# Patient Record
Sex: Male | Born: 1960
Health system: Southern US, Community
[De-identification: ages and names within clinical notes are randomized; demographics above are authoritative.]

## PROBLEM LIST (undated history)

## (undated) DIAGNOSIS — G4730 Sleep apnea, unspecified: Secondary | ICD-10-CM

## (undated) DIAGNOSIS — E785 Hyperlipidemia, unspecified: Secondary | ICD-10-CM

## (undated) DIAGNOSIS — R413 Other amnesia: Secondary | ICD-10-CM

## (undated) DIAGNOSIS — E039 Hypothyroidism, unspecified: Secondary | ICD-10-CM

## (undated) DIAGNOSIS — D352 Benign neoplasm of pituitary gland: Secondary | ICD-10-CM

## (undated) DIAGNOSIS — I639 Cerebral infarction, unspecified: Secondary | ICD-10-CM

## (undated) DIAGNOSIS — E538 Deficiency of other specified B group vitamins: Secondary | ICD-10-CM

## (undated) DIAGNOSIS — N183 Chronic kidney disease, stage 3 unspecified: Secondary | ICD-10-CM

## (undated) DIAGNOSIS — I359 Nonrheumatic aortic valve disorder, unspecified: Secondary | ICD-10-CM

## (undated) DIAGNOSIS — M791 Myalgia, unspecified site: Secondary | ICD-10-CM

## (undated) DIAGNOSIS — I82412 Acute embolism and thrombosis of left femoral vein: Secondary | ICD-10-CM

## (undated) DIAGNOSIS — R768 Other specified abnormal immunological findings in serum: Secondary | ICD-10-CM

## (undated) DIAGNOSIS — D510 Vitamin B12 deficiency anemia due to intrinsic factor deficiency: Secondary | ICD-10-CM

## (undated) DIAGNOSIS — K589 Irritable bowel syndrome without diarrhea: Secondary | ICD-10-CM

## (undated) DIAGNOSIS — R202 Paresthesia of skin: Secondary | ICD-10-CM

## (undated) DIAGNOSIS — D6861 Antiphospholipid syndrome: Secondary | ICD-10-CM

## (undated) DIAGNOSIS — G43109 Migraine with aura, not intractable, without status migrainosus: Secondary | ICD-10-CM

## (undated) DIAGNOSIS — F419 Anxiety disorder, unspecified: Secondary | ICD-10-CM

## (undated) DIAGNOSIS — G43709 Chronic migraine without aura, not intractable, without status migrainosus: Secondary | ICD-10-CM

## (undated) HISTORY — PX: COLONOSCOPY: SHX174

## (undated) HISTORY — PX: OTHER SURGICAL HISTORY: SHX169

## (undated) HISTORY — PX: ESOPHAGOGASTRODUODENOSCOPY: SHX1529

---

## 2004-03-05 ENCOUNTER — Ambulatory Visit: Payer: Self-pay | Admitting: Internal Medicine

## 2004-03-22 ENCOUNTER — Ambulatory Visit: Payer: Self-pay | Admitting: Internal Medicine

## 2004-05-24 ENCOUNTER — Ambulatory Visit: Payer: Self-pay | Admitting: Unknown Physician Specialty

## 2005-07-22 ENCOUNTER — Ambulatory Visit: Payer: Self-pay | Admitting: Internal Medicine

## 2006-10-29 ENCOUNTER — Ambulatory Visit: Payer: Self-pay | Admitting: Internal Medicine

## 2006-11-04 ENCOUNTER — Ambulatory Visit: Payer: Self-pay | Admitting: Internal Medicine

## 2007-02-23 ENCOUNTER — Ambulatory Visit: Payer: Self-pay | Admitting: Unknown Physician Specialty

## 2009-02-16 ENCOUNTER — Ambulatory Visit: Payer: Self-pay | Admitting: Unknown Physician Specialty

## 2010-12-25 ENCOUNTER — Ambulatory Visit: Payer: Self-pay

## 2010-12-27 ENCOUNTER — Ambulatory Visit: Payer: Self-pay | Admitting: Unknown Physician Specialty

## 2013-05-19 ENCOUNTER — Ambulatory Visit: Payer: Self-pay | Admitting: Internal Medicine

## 2014-11-19 ENCOUNTER — Emergency Department
Admission: EM | Admit: 2014-11-19 | Discharge: 2014-11-19 | Disposition: A | Discharge status: Home / Self Care | Payer: 59 | Attending: Emergency Medicine | Admitting: Emergency Medicine

## 2014-11-19 ENCOUNTER — Encounter: Payer: Self-pay | Admitting: Emergency Medicine

## 2014-11-19 ENCOUNTER — Emergency Department: Payer: 59

## 2014-11-19 DIAGNOSIS — M79605 Pain in left leg: Secondary | ICD-10-CM | POA: Insufficient documentation

## 2014-11-19 DIAGNOSIS — M25512 Pain in left shoulder: Secondary | ICD-10-CM | POA: Insufficient documentation

## 2014-11-19 DIAGNOSIS — M79662 Pain in left lower leg: Secondary | ICD-10-CM

## 2014-11-19 DIAGNOSIS — R2242 Localized swelling, mass and lump, left lower limb: Secondary | ICD-10-CM | POA: Diagnosis present

## 2014-11-19 HISTORY — DX: Other amnesia: R41.3

## 2014-11-19 HISTORY — DX: Migraine with aura, not intractable, without status migrainosus: G43.109

## 2014-11-19 HISTORY — DX: Irritable bowel syndrome, unspecified: K58.9

## 2014-11-19 MED ORDER — NAPROXEN 500 MG PO TABS: 500.0000 mg | ORAL_TABLET | Freq: Two times a day (BID) | ORAL | Status: AC

## 2014-11-19 NOTE — Discharge Instructions (Signed)
Ultrasound of your left leg does not find any DVT. The pain appears to be due to muscle strain or possibly a previously ruptured Baker cyst. This can be expected to improve with rest, heating pad, elevation, anti-inflammatory pain medicine like ibuprofen or naproxen, and time.  Heat Therapy Heat therapy can help ease sore, stiff, injured, and tight muscles and joints. Heat relaxes your muscles, which may help ease your pain.  RISKS AND COMPLICATIONS If you have any of the following conditions, do not use heat therapy unless your health care provider has approved:  Poor circulation.  Healing wounds or scarred skin in the area being treated.  Diabetes, heart disease, or high blood pressure.  Not being able to feel (numbness) the area being treated.  Unusual swelling of the area being treated.  Active infections.  Blood clots.  Cancer.  Inability to communicate pain. This may include young children and people who have problems with their brain function (dementia).  Pregnancy. Heat therapy should only be used on old, pre-existing, or long-lasting (chronic) injuries. Do not use heat therapy on new injuries unless directed by your health care provider. HOW TO USE HEAT THERAPY There are several different kinds of heat therapy, including:  Moist heat pack.  Warm water bath.  Hot water bottle.  Electric heating pad.  Heated gel pack.  Heated wrap.  Electric heating pad. Use the heat therapy method suggested by your health care provider. Follow your health care provider's instructions on when and how to use heat therapy. GENERAL HEAT THERAPY RECOMMENDATIONS  Do not sleep while using heat therapy. Only use heat therapy while you are awake.  Your skin may turn pink while using heat therapy. Do not use heat therapy if your skin turns red.  Do not use heat therapy if you have new pain.  High heat or long exposure to heat can cause burns. Be careful when using heat therapy to  avoid burning your skin.  Do not use heat therapy on areas of your skin that are already irritated, such as with a rash or sunburn. SEEK MEDICAL CARE IF:  You have blisters, redness, swelling, or numbness.  You have new pain.  Your pain is worse. MAKE SURE YOU:  Understand these instructions.  Will watch your condition.  Will get help right away if you are not doing well or get worse. Document Released: 06/23/2011 Document Revised: 08/15/2013 Document Reviewed: 05/24/2013 [facility] Patient Information 2015 [place], [place]. This information is not intended to replace advice given to you by your health care provider. Make sure you discuss any questions you have with your health care provider.  Musculoskeletal Pain Musculoskeletal pain is muscle and boney aches and pains. These pains can occur in any part of the body. Your caregiver may treat you without knowing the cause of the pain. They may treat you if blood or urine tests, X-rays, and other tests were normal.  CAUSES There is often not a definite cause or reason for these pains. These pains may be caused by a type of germ (virus). The discomfort may also come from overuse. Overuse includes working out too hard when your body is not fit. Boney aches also come from weather changes. Bone is sensitive to atmospheric pressure changes. HOME CARE INSTRUCTIONS   Ask when your test results will be ready. Make sure you get your test results.  Only take over-the-counter or prescription medicines for pain, discomfort, or fever as directed by your caregiver. If you were given medications for  your condition, do not drive, operate machinery or power tools, or sign legal documents for 24 hours. Do not drink alcohol. Do not take sleeping pills or other medications that may interfere with treatment.  Continue all activities unless the activities cause more pain. When the pain lessens, slowly resume normal activities. Gradually increase the intensity and  duration of the activities or exercise.  During periods of severe pain, bed rest may be helpful. Lay or sit in any position that is comfortable.  Putting ice on the injured area.  Put ice in a bag.  Place a towel between your skin and the bag.  Leave the ice on for 15 to 20 minutes, 3 to 4 times a day.  Follow up with your caregiver for continued problems and no reason can be found for the pain. If the pain becomes worse or does not go away, it may be necessary to repeat tests or do additional testing. Your caregiver may need to look further for a possible cause. SEEK IMMEDIATE MEDICAL CARE IF:  You have pain that is getting worse and is not relieved by medications.  You develop chest pain that is associated with shortness or breath, sweating, feeling sick to your stomach (nauseous), or throw up (vomit).  Your pain becomes localized to the abdomen.  You develop any new symptoms that seem different or that concern you. MAKE SURE YOU:   Understand these instructions.  Will watch your condition.  Will get help right away if you are not doing well or get worse. Document Released: 03/31/2005 Document Revised: 06/23/2011 Document Reviewed: 12/03/2012 [facility] Patient Information 2015 [place], [place]. This information is not intended to replace advice given to you by your health care provider. Make sure you discuss any questions you have with your health care provider.

## 2014-11-19 NOTE — ED Provider Notes (Signed)
[facility] Emergency Department Provider Note  ____________________________________________  Time seen: 6:20 PM  I have reviewed the triage vital signs and the nursing notes.   HISTORY  Chief Complaint Leg Swelling    HPI Tommy Daniels is a 54 y.o. male who complains of pain and swelling to the left calf 4 days. No travel trauma injury surgery. No previous surgery and is to the area. He noted that he did have a knot in his mid calf at the onset of the pain. No history of DVT PE.     Past Medical History  Diagnosis Date  . Short-term memory loss   . Ocular migraine   . IBS (irritable bowel syndrome)     There are no active problems to display for this patient.   History reviewed. No pertinent past surgical history.  Current Outpatient Rx  Name  Route  Sig  Dispense  Refill  . naproxen (NAPROSYN) 500 MG tablet   Oral   Take 1 tablet (500 mg total) by mouth 2 (two) times daily with a meal.   20 tablet   0     Allergies Review of patient's allergies indicates no known allergies.  No family history on file.  Social History History  Substance Use Topics  . Smoking status: Never Smoker   . Smokeless tobacco: Not on file  . Alcohol Use: No    Review of Systems  Constitutional: No fever or chills. No weight changes Eyes:No blurry vision or double vision.  ENT: No sore throat. Cardiovascular: No chest pain. Respiratory: No dyspnea or cough. Gastrointestinal: Negative for abdominal pain, vomiting and diarrhea.  No BRBPR or melena. Genitourinary: Negative for dysuria, urinary retention, bloody urine, or difficulty urinating. Musculoskeletal: Left leg pain as above. Intermittent sharp left scapular pain that occurs every week or 2 and last for a few seconds, which has been occurring for the past several months.  Skin: Negative for rash. Neurological: Negative for headaches, focal weakness or numbness. Psychiatric:No anxiety or  depression.   Endocrine:No hot/cold intolerance, changes in energy, or sleep difficulty.  10-point ROS otherwise negative.  ____________________________________________   PHYSICAL EXAM:  VITAL SIGNS: ED Triage Vitals  Enc Vitals Group     BP 11/19/14 1635 120/69 mmHg     Pulse Rate 11/19/14 1635 61     Resp 11/19/14 1635 16     Temp 11/19/14 1635 97.7 F (36.5 C)     Temp Source 11/19/14 1635 Oral     SpO2 11/19/14 1635 98 %     Weight 11/19/14 1635 229 lb (103.874 kg)     Height 11/19/14 1635 5\' 11"  (1.803 m)     Head Cir --      Peak Flow --      Pain Score 11/19/14 1636 3     Pain Loc --      Pain Edu? --      Excl. in [place]? --      Constitutional: Alert and oriented. Well appearing and in no distress. Ambulatory Eyes: No scleral icterus. No conjunctival pallor. Marland Kitchen EOMI  Musculoskeletal: Left calf swollen and tender to the touch in the bulk of the muscle belly of the left calf. No tenderness or pain along the Achilles tendon. Popliteal fossa is unremarkable. No warmth or erythema or induration. No fluctuance or crepitus. Neurologic:   Normal speech and language.  CN 2-10 normal. Motor grossly intact. No pronator drift.  Normal gait. No gross focal neurologic deficits are  appreciated.  Skin:  Skin is warm, dry and intact. No rash noted.  No petechiae, purpura, or bullae. Psychiatric: Mood and affect are normal. Speech and behavior are normal. Patient exhibits appropriate insight and judgment.  ____________________________________________    LABS (pertinent positives/negatives) (all labs ordered are listed, but only abnormal results are displayed) Labs Reviewed - No data to display ____________________________________________   EKG    ____________________________________________    RADIOLOGY  Ultrasound left lower extremity  unremarkable.  ____________________________________________   PROCEDURES  ____________________________________________   INITIAL IMPRESSION / ASSESSMENT AND PLAN / ED COURSE  Pertinent labs & imaging results that were available during my care of the patient were reviewed by me and considered in my medical decision making (see chart for details).  Patient presents with left calf pain and swelling which is confirmed on exam. There is muscular tenderness. Concerning for DVT versus ruptured Baker's cyst versus muscular strain. Ultrasound unremarkable. We'll treat conservatively with elevation and heating pad NSAIDs and rest. Follow-up with primary care.  ____________________________________________   FINAL CLINICAL IMPRESSION(S) / ED DIAGNOSES  Final diagnoses:  Calf pain, left      Carrie Mew, MD 11/19/14 1857

## 2014-11-19 NOTE — ED Notes (Signed)
AAOx3.  Skin warm and dry.  No SOB/DOE.  Ambulates with easy, steady gait.

## 2014-11-19 NOTE — ED Notes (Signed)
Pt reports pain and swelling to left calf since Wednesday, pt reports "oblong knot back there". Pt ambulatory to triage, denies hx of blood clots, smoking, recent travel.

## 2014-11-24 ENCOUNTER — Ambulatory Visit
Admission: RE | Admit: 2014-11-24 | Discharge: 2014-11-24 | Disposition: A | Discharge status: Home / Self Care | Payer: 59 | Source: Ambulatory Visit | Attending: Internal Medicine | Admitting: Internal Medicine

## 2014-11-24 ENCOUNTER — Other Ambulatory Visit: Payer: Self-pay | Admitting: Internal Medicine

## 2014-11-24 DIAGNOSIS — R600 Localized edema: Secondary | ICD-10-CM | POA: Diagnosis present

## 2014-11-24 DIAGNOSIS — I82812 Embolism and thrombosis of superficial veins of left lower extremities: Secondary | ICD-10-CM | POA: Insufficient documentation

## 2015-03-02 ENCOUNTER — Other Ambulatory Visit: Payer: Self-pay | Admitting: Physician Assistant

## 2015-03-02 ENCOUNTER — Ambulatory Visit
Admission: RE | Admit: 2015-03-02 | Discharge: 2015-03-02 | Disposition: A | Discharge status: Home / Self Care | Payer: 59 | Source: Ambulatory Visit | Attending: Physician Assistant | Admitting: Physician Assistant

## 2015-03-02 DIAGNOSIS — M79604 Pain in right leg: Secondary | ICD-10-CM | POA: Insufficient documentation

## 2015-05-29 ENCOUNTER — Other Ambulatory Visit: Payer: Self-pay | Admitting: Neurology

## 2015-05-29 DIAGNOSIS — G43109 Migraine with aura, not intractable, without status migrainosus: Secondary | ICD-10-CM

## 2015-06-15 ENCOUNTER — Ambulatory Visit
Admission: RE | Admit: 2015-06-15 | Discharge: 2015-06-15 | Disposition: A | Discharge status: Home / Self Care | Payer: 59 | Source: Ambulatory Visit | Attending: Neurology | Admitting: Neurology

## 2015-06-15 DIAGNOSIS — D352 Benign neoplasm of pituitary gland: Secondary | ICD-10-CM | POA: Insufficient documentation

## 2015-06-15 DIAGNOSIS — G43109 Migraine with aura, not intractable, without status migrainosus: Secondary | ICD-10-CM | POA: Diagnosis present

## 2015-06-15 DIAGNOSIS — R413 Other amnesia: Secondary | ICD-10-CM | POA: Diagnosis present

## 2016-04-06 ENCOUNTER — Encounter: Payer: Self-pay | Admitting: Emergency Medicine

## 2016-04-06 ENCOUNTER — Inpatient Hospital Stay
Admission: EM | Admit: 2016-04-06 | Discharge: 2016-04-08 | DRG: 066 | Disposition: A | Discharge status: Home / Self Care | Payer: 59 | Attending: Internal Medicine | Admitting: Internal Medicine

## 2016-04-06 ENCOUNTER — Emergency Department: Payer: 59

## 2016-04-06 DIAGNOSIS — Z7901 Long term (current) use of anticoagulants: Secondary | ICD-10-CM

## 2016-04-06 DIAGNOSIS — I63411 Cerebral infarction due to embolism of right middle cerebral artery: Secondary | ICD-10-CM | POA: Diagnosis not present

## 2016-04-06 DIAGNOSIS — R918 Other nonspecific abnormal finding of lung field: Secondary | ICD-10-CM | POA: Diagnosis present

## 2016-04-06 DIAGNOSIS — Z79899 Other long term (current) drug therapy: Secondary | ICD-10-CM

## 2016-04-06 DIAGNOSIS — G8324 Monoplegia of upper limb affecting left nondominant side: Secondary | ICD-10-CM | POA: Diagnosis present

## 2016-04-06 DIAGNOSIS — Z8249 Family history of ischemic heart disease and other diseases of the circulatory system: Secondary | ICD-10-CM

## 2016-04-06 DIAGNOSIS — Z823 Family history of stroke: Secondary | ICD-10-CM

## 2016-04-06 DIAGNOSIS — Z86718 Personal history of other venous thrombosis and embolism: Secondary | ICD-10-CM

## 2016-04-06 DIAGNOSIS — I639 Cerebral infarction, unspecified: Secondary | ICD-10-CM

## 2016-04-06 DIAGNOSIS — G43B0 Ophthalmoplegic migraine, not intractable: Secondary | ICD-10-CM | POA: Diagnosis present

## 2016-04-06 DIAGNOSIS — I63419 Cerebral infarction due to embolism of unspecified middle cerebral artery: Secondary | ICD-10-CM | POA: Diagnosis not present

## 2016-04-06 DIAGNOSIS — R9389 Abnormal findings on diagnostic imaging of other specified body structures: Secondary | ICD-10-CM

## 2016-04-06 DIAGNOSIS — R29810 Facial weakness: Secondary | ICD-10-CM | POA: Diagnosis present

## 2016-04-06 LAB — DIFFERENTIAL
Basophils Absolute: 0.1 10*3/uL (ref 0–0.1)
Basophils Relative: 1 %
Eosinophils Absolute: 0.2 10*3/uL (ref 0–0.7)
Eosinophils Relative: 2 %
LYMPHS ABS: 2.1 10*3/uL (ref 1.0–3.6)
LYMPHS PCT: 24 %
Monocytes Absolute: 0.9 10*3/uL (ref 0.2–1.0)
Monocytes Relative: 11 %
NEUTROS ABS: 5.5 10*3/uL (ref 1.4–6.5)
Neutrophils Relative %: 62 %

## 2016-04-06 LAB — PROTIME-INR
INR: 1.07
PROTHROMBIN TIME: 13.9 s (ref 11.4–15.2)

## 2016-04-06 LAB — COMPREHENSIVE METABOLIC PANEL
ALK PHOS: 47 U/L (ref 38–126)
ALT: 16 U/L — ABNORMAL LOW (ref 17–63)
AST: 24 U/L (ref 15–41)
Albumin: 4.3 g/dL (ref 3.5–5.0)
Anion gap: 7 (ref 5–15)
BILIRUBIN TOTAL: 0.6 mg/dL (ref 0.3–1.2)
BUN: 20 mg/dL (ref 6–20)
CALCIUM: 9.1 mg/dL (ref 8.9–10.3)
CO2: 27 mmol/L (ref 22–32)
Chloride: 105 mmol/L (ref 101–111)
Creatinine, Ser: 1.76 mg/dL — ABNORMAL HIGH (ref 0.61–1.24)
GFR calc Af Amer: 49 mL/min — ABNORMAL LOW (ref >60)
GFR calc non Af Amer: 42 mL/min — ABNORMAL LOW (ref >60)
GLUCOSE: 101 mg/dL — AB (ref 65–99)
Potassium: 3.8 mmol/L (ref 3.5–5.1)
Sodium: 139 mmol/L (ref 135–145)
TOTAL PROTEIN: 7.2 g/dL (ref 6.5–8.1)

## 2016-04-06 LAB — TROPONIN I: Troponin I: <0.03 ng/mL (ref <0.03)

## 2016-04-06 LAB — CBC
HEMATOCRIT: 45.7 % (ref 40.0–52.0)
HEMOGLOBIN: 15.4 g/dL (ref 13.0–18.0)
MCH: 30.5 pg (ref 26.0–34.0)
MCHC: 33.8 g/dL (ref 32.0–36.0)
MCV: 90.2 fL (ref 80.0–100.0)
PLATELETS: 89 10*3/uL — AB (ref 150–440)
RBC: 5.06 MIL/uL (ref 4.40–5.90)
RDW: 13.2 % (ref 11.5–14.5)
WBC: 8.8 10*3/uL (ref 3.8–10.6)

## 2016-04-06 LAB — APTT: aPTT: 36 seconds (ref 24–36)

## 2016-04-06 LAB — ETHANOL: Alcohol, Ethyl (B): <5 mg/dL (ref <5)

## 2016-04-06 MED ORDER — SODIUM CHLORIDE 0.9 % IV BOLUS (SEPSIS)
1000.0000 mL | Freq: Once | INTRAVENOUS | Status: AC
  Administered 2016-04-06: 1000 mL via INTRAVENOUS

## 2016-04-06 NOTE — ED Notes (Signed)
Pt to MRI att

## 2016-04-06 NOTE — ED Notes (Signed)
Teleneuro with Dr Sandria Manly att, reccommended MRI no further interventions, Dr Kerman Passey notified

## 2016-04-06 NOTE — ED Triage Notes (Signed)
Pt via POV with wife, wife c/o of lip (upper left) droop and left hand numbness since 24 hours prior.  Rapid stroke screen is negative.  Pt on eliquis for blood clot hx

## 2016-04-06 NOTE — ED Provider Notes (Signed)
[facility] Emergency Department Provider Note  Time seen: 10:13 PM  I have reviewed the triage vital signs and the nursing notes.   HISTORY  Chief Complaint Code Stroke    HPI Tommy Daniels is a 55 y.o. male presents to the emergency department with acute onset of left facial droop. According to the patient's wife approximately one hour ago she noted that the left side of the patient's face was slightly drooped especially during talking. Patient states he does not see any obvious change, does not feel any numbness or weakness in the face although he states since yesterday he has been experiencing some left arm numbness and tingling. Denies any weakness of any arm or leg, denies slurred speech or confusion. Wife denies any difficulty talking or walking.Patient denies any history of CVA in the past. He is currently on blood thinners for a prior DVT.  Past Medical History:  Diagnosis Date  . IBS (irritable bowel syndrome)   . Ocular migraine   . Short-term memory loss     There are no active problems to display for this patient.   No past surgical history on file.  Prior to Admission medications   Medication Sig Start Date End Date Taking? Authorizing Provider  naproxen (NAPROSYN) 500 MG tablet Take 1 tablet (500 mg total) by mouth 2 (two) times daily with a meal. 11/19/14   Carrie Mew, MD    No Known Allergies  No family history on file.  Social History Social History  Substance Use Topics  . Smoking status: Never Smoker  . Smokeless tobacco: Not on file  . Alcohol use No    Review of Systems Constitutional: Negative for fever. Cardiovascular: Negative for chest pain. Respiratory: Negative for shortness of breath. Gastrointestinal: Negative for abdominal pain Neurological: Negative for headache. Mild tingling numbness of the left upper extremity since yesterday, left facial droop. 10-point ROS otherwise  negative.  ____________________________________________   PHYSICAL EXAM:  Constitutional: Alert and oriented. Well appearing and in no distress. Eyes: Normal exam, 2-3 mm PERRL, EOMI. ENT   Head: Normocephalic and atraumatic.   Mouth/Throat: Mucous membranes are moist. Cardiovascular: Normal rate, regular rhythm. No murmur Respiratory: Normal respiratory effort without tachypnea nor retractions. Breath sounds are clear  Gastrointestinal: Soft and nontender. No distention.  Musculoskeletal: Nontender with normal range of motion in all extremities. Neurologic:  Normal speech and language. Patient does have a slight left facial droop. Normal sensation bilaterally. 5/5 motor in all extremities, with normal subjective sensation in all extremities. No pronator drift.  Skin:  Skin is warm, dry and intact.  Psychiatric: Mood and affect are normal. Speech and behavior are normal.   ____________________________________________    EKG  EKG reviewed and interpreted by myself shows normal sinus rhythm at 80 bpm, narrow QRS, normal axis, normal intervals, no sick changes per normal EKG.  ____________________________________________    RADIOLOGY  CT head negative  ____________________________________________   INITIAL IMPRESSION / ASSESSMENT AND PLAN / ED COURSE  Pertinent labs & imaging results that were available during my care of the patient were reviewed by me and considered in my medical decision making (see chart for details).  The patient presents to the emergency department with acute onset of left facial droop which the wife believes occurred approximately one hour ago. Patient does have a slight left facial droop, otherwise a normal neurological exam. This would raise suspicion for possible Bell's palsy however the patient also states since yesterday he has been experiencing  tingling and numbness of the left upper extremity. Equal strength and motor. We will consult  telemetry neurology for their input. Patient is currently on blood thinners for a DVT, and given his left upper extremity numbness starting yesterday do not believe the patient would be a TPA candidate.  Patient's workup is largely within normal limits. CT negative. Patient does have some mild renal insufficiency compared to his baseline creatinine around 1.2. We will IV hydrate while in the emergency department. Neurology is currently evaluating the patient, we will await their recommendations.  Neurology is seen the patient, they recommended obtaining an MRI. If the MRI is normal the patient can be discharged from the emergency department. If the MRI is positive for CVA the recommended admission to the hospital for further workup. Patient is already anticoagulated on medications.  MRI pending, patient care signed out to Dr. Dahlia Client. ____________________________________________   FINAL CLINICAL IMPRESSION(S) / ED DIAGNOSES  Left facial weakness/droop    Harvest Dark, MD 04/06/16 [phone]

## 2016-04-07 ENCOUNTER — Observation Stay: Payer: 59

## 2016-04-07 ENCOUNTER — Encounter: Payer: Self-pay | Admitting: Internal Medicine

## 2016-04-07 ENCOUNTER — Inpatient Hospital Stay: Payer: 59

## 2016-04-07 ENCOUNTER — Observation Stay
Admit: 2016-04-07 | Discharge: 2016-04-07 | Disposition: A | Discharge status: Home / Self Care | Payer: 59 | Attending: Internal Medicine | Admitting: Internal Medicine

## 2016-04-07 DIAGNOSIS — R29810 Facial weakness: Secondary | ICD-10-CM | POA: Diagnosis present

## 2016-04-07 DIAGNOSIS — Z7901 Long term (current) use of anticoagulants: Secondary | ICD-10-CM | POA: Diagnosis not present

## 2016-04-07 DIAGNOSIS — G43B0 Ophthalmoplegic migraine, not intractable: Secondary | ICD-10-CM | POA: Diagnosis present

## 2016-04-07 DIAGNOSIS — I63411 Cerebral infarction due to embolism of right middle cerebral artery: Principal | ICD-10-CM

## 2016-04-07 DIAGNOSIS — G8324 Monoplegia of upper limb affecting left nondominant side: Secondary | ICD-10-CM | POA: Diagnosis present

## 2016-04-07 DIAGNOSIS — I63419 Cerebral infarction due to embolism of unspecified middle cerebral artery: Secondary | ICD-10-CM | POA: Diagnosis present

## 2016-04-07 DIAGNOSIS — Z8249 Family history of ischemic heart disease and other diseases of the circulatory system: Secondary | ICD-10-CM | POA: Diagnosis not present

## 2016-04-07 DIAGNOSIS — Z86718 Personal history of other venous thrombosis and embolism: Secondary | ICD-10-CM | POA: Diagnosis not present

## 2016-04-07 DIAGNOSIS — R918 Other nonspecific abnormal finding of lung field: Secondary | ICD-10-CM | POA: Diagnosis present

## 2016-04-07 DIAGNOSIS — Z823 Family history of stroke: Secondary | ICD-10-CM | POA: Diagnosis not present

## 2016-04-07 DIAGNOSIS — Z79899 Other long term (current) drug therapy: Secondary | ICD-10-CM | POA: Diagnosis not present

## 2016-04-07 DIAGNOSIS — I639 Cerebral infarction, unspecified: Secondary | ICD-10-CM | POA: Diagnosis present

## 2016-04-07 LAB — URINE DRUG SCREEN, QUALITATIVE (ARMC ONLY)
Amphetamines, Ur Screen: NOT DETECTED
BARBITURATES, UR SCREEN: NOT DETECTED
BENZODIAZEPINE, UR SCRN: NOT DETECTED
CANNABINOID 50 NG, UR ~~LOC~~: NOT DETECTED
Cocaine Metabolite,Ur ~~LOC~~: NOT DETECTED
MDMA (Ecstasy)Ur Screen: NOT DETECTED
METHADONE SCREEN, URINE: NOT DETECTED
Opiate, Ur Screen: NOT DETECTED
Phencyclidine (PCP) Ur S: NOT DETECTED
TRICYCLIC, UR SCREEN: NOT DETECTED

## 2016-04-07 LAB — URINALYSIS, ROUTINE W REFLEX MICROSCOPIC
Bilirubin Urine: NEGATIVE
Glucose, UA: NEGATIVE mg/dL
Hgb urine dipstick: NEGATIVE
KETONES UR: NEGATIVE mg/dL
LEUKOCYTES UA: NEGATIVE
NITRITE: NEGATIVE
PROTEIN: NEGATIVE mg/dL
Specific Gravity, Urine: 1.005 (ref 1.005–1.030)
pH: 5 (ref 5.0–8.0)

## 2016-04-07 LAB — LIPID PANEL
Cholesterol: 193 mg/dL (ref 0–200)
HDL: 44 mg/dL (ref >40)
LDL CALC: 132 mg/dL — AB (ref 0–99)
Total CHOL/HDL Ratio: 4.4 RATIO
Triglycerides: 87 mg/dL (ref <150)
VLDL: 17 mg/dL (ref 0–40)

## 2016-04-07 LAB — ANTITHROMBIN III: AntiThromb III Func: 102 % (ref 75–120)

## 2016-04-07 MED ORDER — ASPIRIN 300 MG RE SUPP: 300.0000 mg | Freq: Every day | RECTAL | Status: DC

## 2016-04-07 MED ORDER — LEVOTHYROXINE SODIUM 75 MCG PO TABS
75.0000 ug | ORAL_TABLET | Freq: Every day | ORAL | Status: DC
  Administered 2016-04-07: 75 ug via ORAL
  Filled 2016-04-07: qty 1

## 2016-04-07 MED ORDER — DULOXETINE HCL 60 MG PO CPEP
60.0000 mg | ORAL_CAPSULE | Freq: Every day | ORAL | Status: DC
  Administered 2016-04-07 – 2016-04-08 (×2): 60 mg via ORAL
  Filled 2016-04-07 (×2): qty 1

## 2016-04-07 MED ORDER — SODIUM CHLORIDE 0.9 % IV SOLN
INTRAVENOUS | Status: DC
  Administered 2016-04-08: 08:00:00 via INTRAVENOUS

## 2016-04-07 MED ORDER — ACETAMINOPHEN 650 MG RE SUPP: 650.0000 mg | RECTAL | Status: DC | PRN

## 2016-04-07 MED ORDER — ALFUZOSIN HCL ER 10 MG PO TB24
10.0000 mg | ORAL_TABLET | Freq: Every day | ORAL | Status: DC
  Administered 2016-04-07: 21:00:00 10 mg via ORAL
  Filled 2016-04-07 (×2): qty 1

## 2016-04-07 MED ORDER — SENNOSIDES-DOCUSATE SODIUM 8.6-50 MG PO TABS: 1.0000 | ORAL_TABLET | Freq: Every evening | ORAL | Status: DC | PRN

## 2016-04-07 MED ORDER — APIXABAN 5 MG PO TABS
5.0000 mg | ORAL_TABLET | Freq: Two times a day (BID) | ORAL | Status: DC
  Administered 2016-04-07 – 2016-04-08 (×3): 5 mg via ORAL
  Filled 2016-04-07 (×3): qty 1

## 2016-04-07 MED ORDER — VITAMIN B-12 1000 MCG PO TABS
1000.0000 ug | ORAL_TABLET | Freq: Every day | ORAL | Status: DC
  Administered 2016-04-07 – 2016-04-08 (×2): 1000 ug via ORAL
  Filled 2016-04-07 (×2): qty 1

## 2016-04-07 MED ORDER — ASPIRIN 325 MG PO TABS
325.0000 mg | ORAL_TABLET | Freq: Every day | ORAL | Status: DC
  Administered 2016-04-07 – 2016-04-08 (×2): 325 mg via ORAL
  Filled 2016-04-07 (×2): qty 1

## 2016-04-07 MED ORDER — PROPRANOLOL HCL 40 MG PO TABS
40.0000 mg | ORAL_TABLET | Freq: Two times a day (BID) | ORAL | Status: DC
  Administered 2016-04-07 – 2016-04-08 (×3): 40 mg via ORAL
  Filled 2016-04-07 (×3): qty 1

## 2016-04-07 MED ORDER — STROKE: EARLY STAGES OF RECOVERY BOOK
Freq: Once | Status: AC
  Administered 2016-04-07: 06:00:00

## 2016-04-07 MED ORDER — ACETAMINOPHEN 325 MG PO TABS: 650.0000 mg | ORAL_TABLET | ORAL | Status: DC | PRN

## 2016-04-07 MED ORDER — ACETAMINOPHEN 160 MG/5ML PO SOLN: 650.0000 mg | ORAL | Status: DC | PRN

## 2016-04-07 MED ORDER — METHYLPHENIDATE HCL 5 MG PO TABS
10.0000 mg | ORAL_TABLET | Freq: Two times a day (BID) | ORAL | Status: DC
  Filled 2016-04-07: qty 2

## 2016-04-07 NOTE — Progress Notes (Signed)
Patient updated plan for TEE with Dr. Nehemiah Massed tomorrow, December 26.

## 2016-04-07 NOTE — H&P (Signed)
[facility] [place] NAME: Tommy Daniels    MR#:  [id number]  DATE OF BIRTH:  Mar 29, 1961  DATE OF ADMISSION:  04/06/2016  PRIMARY CARE PHYSICIAN: Kirk Ruths., MD   REQUESTING/REFERRING PHYSICIAN:   CHIEF COMPLAINT:   Chief Complaint  Patient presents with  . Code Stroke    HISTORY OF PRESENT ILLNESS: Tommy Daniels  is a 55 y.o. male with a known history of Ocular migraine, irritable bowel syndrome, short-term memory loss presented to the emergency room with left facial droop. Left facial droop started yesterday patient on blood thinner medication eliquis for DVT in the left leg. Daily for history patient also had some numbness in the left upper extremity. No complaints of any slurred speech. Patient was evaluated with CT head and MRI brain in the emergency room. MRI brain showed ischemic CVA in the right MCA distribution in the right frontal and parietal lobes. Telemetry neurology consultation was done who recommended admission and further workup. No complaints of any chest pain, shortness of breath. No history of headache dizziness or blurry vision. No history of syncope or seizure. Patient is compliant with his medications. He never had any stroke in the past.  PAST MEDICAL HISTORY:   Past Medical History:  Diagnosis Date  . IBS (irritable bowel syndrome)   . Ocular migraine   . Short-term memory loss     PAST SURGICAL HISTORY: Past Surgical History:  Procedure Laterality Date  . none      SOCIAL HISTORY:  Social History  Substance Use Topics  . Smoking status: Never Smoker  . Smokeless tobacco: Never Used  . Alcohol use No    FAMILY HISTORY:  Family History  Problem Relation Age of Onset  . Heart disease Mother   . Heart disease Brother     DRUG ALLERGIES: No Known Allergies  REVIEW OF SYSTEMS:   CONSTITUTIONAL: No fever, fatigue or weakness.  EYES: No blurred or double vision.  EARS, NOSE, AND  THROAT: No tinnitus or ear pain.  RESPIRATORY: No cough, shortness of breath, wheezing or hemoptysis.  CARDIOVASCULAR: No chest pain, orthopnea, edema.  GASTROINTESTINAL: No nausea, vomiting, diarrhea or abdominal pain.  GENITOURINARY: No dysuria, hematuria.  ENDOCRINE: No polyuria, nocturia,  HEMATOLOGY: No anemia, easy bruising or bleeding SKIN: No rash or lesion. MUSCULOSKELETAL: No joint pain or arthritis.   NEUROLOGIC: Left upper extremity numbness Left facial droop. PSYCHIATRY: No anxiety or depression.   MEDICATIONS AT HOME:  Prior to Admission medications   Medication Sig Start Date End Date Taking? Authorizing Provider  alfuzosin (UROXATRAL) 10 MG 24 hr tablet Take 10 mg by mouth daily.   Yes Historical Provider, MD  clomiPHENE (CLOMID) 50 MG tablet Take 50 mg by mouth every other day.   Yes Historical Provider, MD  diclofenac sodium (VOLTAREN) 1 % GEL Apply 4 g topically daily.   Yes Historical Provider, MD  DULoxetine (CYMBALTA) 60 MG capsule Take 60 mg by mouth daily.   Yes Historical Provider, MD  ELIQUIS 5 MG TABS tablet Take 5 mg by mouth 2 (two) times daily.   Yes Historical Provider, MD  levothyroxine (SYNTHROID, LEVOTHROID) 75 MCG tablet Take 75 mcg by mouth daily before breakfast.   Yes Historical Provider, MD  methylphenidate (RITALIN) 10 MG tablet Take 10 mg by mouth 2 (two) times daily.   Yes Historical Provider, MD  propranolol (INDERAL) 40 MG tablet Take 40 mg by mouth 2 (two) times daily.   Yes  Historical Provider, MD  vitamin B-12 (CYANOCOBALAMIN) 1000 MCG tablet Take 1,000 mcg by mouth daily.   Yes Historical Provider, MD  naproxen (NAPROSYN) 500 MG tablet Take 1 tablet (500 mg total) by mouth 2 (two) times daily with a meal. Patient not taking: Reported on 04/06/2016 11/19/14   Carrie Mew, MD      PHYSICAL EXAMINATION:   VITAL SIGNS: Blood pressure (!) 142/79, pulse 74, temperature 98.5 F (36.9 C), temperature source Oral, resp. rate 15, height 5\' 11"   (1.803 m), weight 108.9 kg (240 lb), SpO2 98 %.  GENERAL:  55 y.o.-year-old patient lying in the bed with no acute distress.  EYES: Pupils equal, round, reactive to light and accommodation. No scleral icterus. Extraocular muscles intact.  HEENT: Head atraumatic, normocephalic. Oropharynx and nasopharynx clear.  NECK:  Supple, no jugular venous distention. No thyroid enlargement, no tenderness.  LUNGS: Normal breath sounds bilaterally, no wheezing, rales,rhonchi or crepitation. No use of accessory muscles of respiration.  CARDIOVASCULAR: S1, S2 normal. No murmurs, rubs, or gallops.  ABDOMEN: Soft, nontender, nondistended. Bowel sounds present. No organomegaly or mass.  EXTREMITIES: No pedal edema, cyanosis, or clubbing.  NEUROLOGIC: Cranial nerves II through XII are intact. Muscle strength 5/5 in all extremities. Sensation intact. Gait not checked.  Left facial droop improved.Left upper extremity numbness. PSYCHIATRIC: The patient is alert and oriented x 3.  SKIN: No obvious rash, lesion, or ulcer.   LABORATORY PANEL:   CBC  Recent Labs Lab 04/06/16 2214  WBC 8.8  HGB 15.4  HCT 45.7  PLT 89*  MCV 90.2  MCH 30.5  MCHC 33.8  RDW 13.2  LYMPHSABS 2.1  MONOABS 0.9  EOSABS 0.2  BASOSABS 0.1   ------------------------------------------------------------------------------------------------------------------  Chemistries   Recent Labs Lab 04/06/16 2214  NA 139  K 3.8  CL 105  CO2 27  GLUCOSE 101*  BUN 20  CREATININE 1.76*  CALCIUM 9.1  AST 24  ALT 16*  ALKPHOS 47  BILITOT 0.6   ------------------------------------------------------------------------------------------------------------------ estimated creatinine clearance is 60.2 mL/min (by C-G formula based on SCr of 1.76 mg/dL (H)). ------------------------------------------------------------------------------------------------------------------ No results for input(s): TSH, T4TOTAL, T3FREE, THYROIDAB in the last 72  hours.  Invalid input(s): FREET3   Coagulation profile  Recent Labs Lab 04/06/16 2214  INR 1.07   ------------------------------------------------------------------------------------------------------------------- No results for input(s): DDIMER in the last 72 hours. -------------------------------------------------------------------------------------------------------------------  Cardiac Enzymes  Recent Labs Lab 04/06/16 2214  TROPONINI <0.03   ------------------------------------------------------------------------------------------------------------------ Invalid input(s): POCBNP  ---------------------------------------------------------------------------------------------------------------  Urinalysis    Component Value Date/Time   COLORURINE STRAW (A) 04/06/2016 2350   APPEARANCEUR CLEAR (A) 04/06/2016 2350   LABSPEC 1.005 04/06/2016 2350   PHURINE 5.0 04/06/2016 2350   GLUCOSEU NEGATIVE 04/06/2016 2350   HGBUR NEGATIVE 04/06/2016 2350   BILIRUBINUR NEGATIVE 04/06/2016 2350   KETONESUR NEGATIVE 04/06/2016 2350   PROTEINUR NEGATIVE 04/06/2016 2350   NITRITE NEGATIVE 04/06/2016 2350   LEUKOCYTESUR NEGATIVE 04/06/2016 2350     RADIOLOGY: Ct Head Wo Contrast  Result Date: 04/06/2016 CLINICAL DATA:  Acute left facial droop prior to arrival. History of migraines. EXAM: CT HEAD WITHOUT CONTRAST TECHNIQUE: Contiguous axial images were obtained from the base of the skull through the vertex without intravenous contrast. COMPARISON:  None. FINDINGS: BRAIN: The ventricles and sulci are normal. No intraparenchymal hemorrhage, mass effect nor midline shift. No acute large vascular territory infarcts. Grey-white matter distinction is maintained. The basal ganglia are unremarkable. No abnormal extra-axial fluid collections. Basal cisterns are patent. The brainstem and cerebellar hemispheres are without  acute abnormalities. VASCULAR: Unremarkable. SKULL/SOFT TISSUES: No skull  fracture. No significant soft tissue swelling. ORBITS/SINUSES: The included ocular globes and orbital contents are normal.The mastoid air cells are clear. The included paranasal sinuses are well-aerated. OTHER: None. IMPRESSION: No acute intracranial abnormality. Critical Value/emergent results were called by telephone at the time of interpretation on 04/06/2016 at 10:09 pm to Dr. Harvest Dark , who verbally acknowledged these results. Electronically Signed   By: Ashley Royalty M.D.   On: 04/06/2016 22:11   Mr Brain Wo Contrast  Result Date: 04/07/2016 CLINICAL DATA:  Acute onset left facial droop EXAM: MRI HEAD WITHOUT CONTRAST TECHNIQUE: Multiplanar, multiecho pulse sequences of the brain and surrounding structures were obtained without intravenous contrast. COMPARISON:  Head CT 04/06/2016 Brain MRI 06/15/2015 FINDINGS: Brain: There are multiple small foci of diffusion restriction within the right MCA distribution, predominantly within the right frontal lobe, at the base of the superior frontal and precentral gyri and at the peripheral aspect of the postcentral gyrus of the parietal lobe. Additionally, there is a punctate focus of diffusion restriction within the left occipital lobe. There is multifocal hyperintense T2-weighted signal within the periventricular white matter, most often seen in the setting of chronic microvascular ischemia. No mass lesion or midline shift. No hydrocephalus or extra-axial fluid collection. The midline structures are normal. No age advanced or lobar predominant atrophy. Vascular: Major intracranial arterial and venous sinus flow voids are preserved. No evidence of chronic microhemorrhage or amyloid angiopathy. Skull and upper cervical spine: The visualized skull base, calvarium, upper cervical spine and extracranial soft tissues are normal. Sinuses/Orbits: No fluid levels or advanced mucosal thickening. No mastoid effusion. Normal orbits. IMPRESSION: 1. Multiple small foci of  acute ischemia, predominantly within the right MCA distribution, involving the right frontal and parietal lobes. Single contralateral focus of acute ischemia in the left occipital lobe. This distribution suggests a central cardioembolic process. 2. No hemorrhage or mass effect. Electronically Signed   By: Ulyses Jarred M.D.   On: 04/07/2016 00:32    EKG: Orders placed or performed during the hospital encounter of 04/06/16  . ED EKG  . ED EKG    IMPRESSION AND PLAN: 55 year old male patient with history of ocular migraine, irritable bowel syndrome, short-term memory loss presented to the emergency room with left upper extremity numbness and left facial droop. Admitting diagnosis 1. Right MCA ischemic CVA possible embolic etiology 2. History of ocular migraine 3. History of left leg DVT Treatment plan Admit patient to medical floor observation bed Continue eliquis for anticoagulation Neurology consultation Start patient on aspirin DVT prophylaxis sequential compression devices to lower extremities Supportive care.  All the records are reviewed and case discussed with ED provider. Management plans discussed with the patient, family and they are in agreement.  CODE STATUS:FULL CODE Code Status History    This patient does not have a recorded code status. Please follow your organizational policy for patients in this situation.       TOTAL TIME TAKING CARE OF THIS PATIENT: 51 minutes.    Saundra Shelling M.D on 04/07/2016 at 4:52 AM  Between 7am to 6pm - Pager - [phone]  After 6pm go to [email or web address] - password [email or web address] [facility]  Office  [phone]  CC: Primary care physician; Kirk Ruths., MD

## 2016-04-07 NOTE — Consult Note (Signed)
Reason for Consult:L sided weakness  Referring Physician: Dr. Posey Pronto   CC: L sided weakness   HPI: Tommy Daniels is an 55 y.o. male  with a known history of Ocular migraine, irritable bowel syndrome, short-term memory loss presented to the emergency room with left facial droop and L sided numbness. Pt is on anticoagulation for for history of DVT. States did not miss any doses.    MRI shows acute infarcts in the [facility] territory ? cardio embolic in nature.    Past Medical History:  Diagnosis Date  . IBS (irritable bowel syndrome)   . Ocular migraine   . Short-term memory loss     Past Surgical History:  Procedure Laterality Date  . none      Family History  Problem Relation Age of Onset  . Heart disease Mother   . Heart disease Brother     Social History:  reports that he has never smoked. He has never used smokeless tobacco. He reports that he drinks about 1.8 - 2.4 oz of alcohol per week . He reports that he does not use drugs.  No Known Allergies  Medications: I have reviewed the patient's current medications.  ROS: History obtained from the patient  General ROS: negative for - chills, fatigue, fever, night sweats, weight gain or weight loss Psychological ROS: negative for - behavioral disorder, hallucinations, memory difficulties, mood swings or suicidal ideation Ophthalmic ROS: negative for - blurry vision, double vision, eye pain or loss of vision ENT ROS: negative for - epistaxis, nasal discharge, oral lesions, sore throat, tinnitus or vertigo Allergy and Immunology ROS: negative for - hives or itchy/watery eyes Hematological and Lymphatic ROS: negative for - bleeding problems, bruising or swollen lymph nodes Endocrine ROS: negative for - galactorrhea, hair pattern changes, polydipsia/polyuria or temperature intolerance Respiratory ROS: negative for - cough, hemoptysis, shortness of breath or wheezing Cardiovascular ROS: negative for - chest pain, dyspnea on  exertion, edema or irregular heartbeat Gastrointestinal ROS: negative for - abdominal pain, diarrhea, hematemesis, nausea/vomiting or stool incontinence Genito-Urinary ROS: negative for - dysuria, hematuria, incontinence or urinary frequency/urgency Musculoskeletal ROS: negative for - joint swelling or muscular weakness Neurological ROS: as noted in HPI Dermatological ROS: negative for rash and skin lesion changes  Physical Examination: Blood pressure 139/79, pulse 66, temperature 98.4 F (36.9 C), temperature source Oral, resp. rate 16, height 5\' 11"  (1.803 m), weight 111.6 kg (246 lb), SpO2 97 %.   Neurological Examination Mental Status: Alert, oriented, thought content appropriate.  Speech fluent without evidence of aphasia.  Able to follow 3 step commands without difficulty. Cranial Nerves: II: Discs flat bilaterally; Visual fields grossly normal, pupils equal, round, reactive to light and accommodation III,IV, VI: ptosis not present, extra-ocular motions intact bilaterally V,VII: smile symmetric, facial light touch sensation normal bilaterally VIII: hearing normal bilaterally IX,X: gag reflex present XI: bilateral shoulder shrug XII: midline tongue extension Motor: Right : Upper extremity   5/5    Left:     Upper extremity   4+/5  Lower extremity   5/5     Lower extremity   5/5 Tone and bulk:normal tone throughout; no atrophy noted Sensory: LUE decreased sensation to light touch.  Deep Tendon Reflexes: 2+ and symmetric throughout Plantars: Right: downgoing   Left: downgoing Cerebellar: normal finger-to-nose, normal rapid alternating movements and normal heel-to-shin test Gait: normal gait and station      Laboratory Studies:   Basic Metabolic Panel:  Recent Labs Lab 04/06/16 2214  NA 139  K 3.8  CL 105  CO2 27  GLUCOSE 101*  BUN 20  CREATININE 1.76*  CALCIUM 9.1    Liver Function Tests:  Recent Labs Lab 04/06/16 2214  AST 24  ALT 16*  ALKPHOS 47   BILITOT 0.6  PROT 7.2  ALBUMIN 4.3   No results for input(s): LIPASE, AMYLASE in the last 168 hours. No results for input(s): AMMONIA in the last 168 hours.  CBC:  Recent Labs Lab 04/06/16 2214  WBC 8.8  NEUTROABS 5.5  HGB 15.4  HCT 45.7  MCV 90.2  PLT 89*    Cardiac Enzymes:  Recent Labs Lab 04/06/16 2214  TROPONINI <0.03    BNP: Invalid input(s): POCBNP  CBG: No results for input(s): GLUCAP in the last 168 hours.  Microbiology: No results found for this or any previous visit.  Coagulation Studies:  Recent Labs  04/06/16 2214  LABPROT 13.9  INR 1.07    Urinalysis:  Recent Labs Lab 04/06/16 2350  COLORURINE STRAW*  LABSPEC 1.005  PHURINE 5.0  GLUCOSEU NEGATIVE  HGBUR NEGATIVE  BILIRUBINUR NEGATIVE  KETONESUR NEGATIVE  PROTEINUR NEGATIVE  NITRITE NEGATIVE  LEUKOCYTESUR NEGATIVE    Lipid Panel:     Component Value Date/Time   CHOL 193 04/07/2016 0555   TRIG 87 04/07/2016 0555   HDL 44 04/07/2016 0555   CHOLHDL 4.4 04/07/2016 0555   VLDL 17 04/07/2016 0555   LDLCALC 132 (H) 04/07/2016 0555    HgbA1C: No results found for: HGBA1C  Urine Drug Screen:     Component Value Date/Time   LABOPIA NONE DETECTED 04/06/2016 2350   COCAINSCRNUR NONE DETECTED 04/06/2016 2350   LABBENZ NONE DETECTED 04/06/2016 2350   AMPHETMU NONE DETECTED 04/06/2016 2350   THCU NONE DETECTED 04/06/2016 2350   LABBARB NONE DETECTED 04/06/2016 2350    Alcohol Level:  Recent Labs Lab 04/06/16 2214  ETH <5    Other results: EKG: normal EKG, normal sinus rhythm, unchanged from previous tracings.  Imaging: Dg Chest 2 View  Result Date: 04/07/2016 CLINICAL DATA:  Left-sided facial droop since last night with left hand numbness 1 year. EXAM: CHEST  2 VIEW COMPARISON:  None. FINDINGS: Lungs are adequately inflated without consolidation or effusion. Increased focal density in the suprahilar region on the lateral film. Cardiomediastinal silhouette is within  normal. Remaining bones and soft tissues are unremarkable. IMPRESSION: No acute cardiopulmonary disease per Increased focal density over the suprahilar region on the lateral film as cannot exclude a central lung/hilar mass. Recommend follow-up chest radiograph 3 months versus contrast-enhanced chest CT on elective basis for further evaluation. Electronically Signed   By: Marin Olp M.D.   On: 04/07/2016 08:59   Ct Head Wo Contrast  Result Date: 04/06/2016 CLINICAL DATA:  Acute left facial droop prior to arrival. History of migraines. EXAM: CT HEAD WITHOUT CONTRAST TECHNIQUE: Contiguous axial images were obtained from the base of the skull through the vertex without intravenous contrast. COMPARISON:  None. FINDINGS: BRAIN: The ventricles and sulci are normal. No intraparenchymal hemorrhage, mass effect nor midline shift. No acute large vascular territory infarcts. Grey-white matter distinction is maintained. The basal ganglia are unremarkable. No abnormal extra-axial fluid collections. Basal cisterns are patent. The brainstem and cerebellar hemispheres are without acute abnormalities. VASCULAR: Unremarkable. SKULL/SOFT TISSUES: No skull fracture. No significant soft tissue swelling. ORBITS/SINUSES: The included ocular globes and orbital contents are normal.The mastoid air cells are clear. The included paranasal sinuses are well-aerated. OTHER: None. IMPRESSION: No acute intracranial abnormality. Critical Value/emergent  results were called by telephone at the time of interpretation on 04/06/2016 at 10:09 pm to Dr. Harvest Dark , who verbally acknowledged these results. Electronically Signed   By: Ashley Royalty M.D.   On: 04/06/2016 22:11   Mr Brain Wo Contrast  Result Date: 04/07/2016 CLINICAL DATA:  Acute onset left facial droop EXAM: MRI HEAD WITHOUT CONTRAST TECHNIQUE: Multiplanar, multiecho pulse sequences of the brain and surrounding structures were obtained without intravenous contrast.  COMPARISON:  Head CT 04/06/2016 Brain MRI 06/15/2015 FINDINGS: Brain: There are multiple small foci of diffusion restriction within the right MCA distribution, predominantly within the right frontal lobe, at the base of the superior frontal and precentral gyri and at the peripheral aspect of the postcentral gyrus of the parietal lobe. Additionally, there is a punctate focus of diffusion restriction within the left occipital lobe. There is multifocal hyperintense T2-weighted signal within the periventricular white matter, most often seen in the setting of chronic microvascular ischemia. No mass lesion or midline shift. No hydrocephalus or extra-axial fluid collection. The midline structures are normal. No age advanced or lobar predominant atrophy. Vascular: Major intracranial arterial and venous sinus flow voids are preserved. No evidence of chronic microhemorrhage or amyloid angiopathy. Skull and upper cervical spine: The visualized skull base, calvarium, upper cervical spine and extracranial soft tissues are normal. Sinuses/Orbits: No fluid levels or advanced mucosal thickening. No mastoid effusion. Normal orbits. IMPRESSION: 1. Multiple small foci of acute ischemia, predominantly within the right MCA distribution, involving the right frontal and parietal lobes. Single contralateral focus of acute ischemia in the left occipital lobe. This distribution suggests a central cardioembolic process. 2. No hemorrhage or mass effect. Electronically Signed   By: Ulyses Jarred M.D.   On: 04/07/2016 00:32   US Carotid Bilateral (at [facility] Only)  Result Date: 04/07/2016 CLINICAL DATA:  Small right MCA territory embolic strokes. EXAM: BILATERAL CAROTID DUPLEX ULTRASOUND TECHNIQUE: Pearline Cables scale imaging, color Doppler and duplex ultrasound were performed of bilateral carotid and vertebral arteries in the neck. COMPARISON:  04/07/2016 FINDINGS: Criteria: Quantification of carotid stenosis is based on velocity parameters  that correlate the residual internal carotid diameter with NASCET-based stenosis levels, using the diameter of the distal internal carotid lumen as the denominator for stenosis measurement. The following velocity measurements were obtained: RIGHT ICA:  76/27 cm/sec CCA:  99991111 cm/sec SYSTOLIC ICA/CCA RATIO:  0.8 DIASTOLIC ICA/CCA RATIO:  1.2 ECA:  93 cm/sec LEFT ICA:  68/25 cm/sec CCA:  0000000 cm/sec SYSTOLIC ICA/CCA RATIO:  0.8 DIASTOLIC ICA/CCA RATIO:  1.0 ECA:  92 cm/sec RIGHT CAROTID ARTERY: Minor intimal thickening. No significant atherosclerotic Plaque formation. No hemodynamically significant right ICA stenosis, velocity elevation, or turbulent flow. Degree of narrowing less than 50%. RIGHT VERTEBRAL ARTERY:  Antegrade LEFT CAROTID ARTERY: Similar scattered minor intimal thickening. No significant atherosclerotic Plaque formation. No hemodynamically significant left ICA stenosis, velocity elevation, or turbulent flow. LEFT VERTEBRAL ARTERY:  Antegrade IMPRESSION: Minor carotid intimal thickening. No significant carotid atherosclerosis or ICA stenosis. Degree of narrowing less than 50% bilaterally. Patent antegrade vertebral flow bilaterally Electronically Signed   By: Jerilynn Mages.  Shick M.D.   On: 04/07/2016 09:25     Assessment/Plan:   55 y.o. male  with a known history of Ocular migraine, irritable bowel syndrome, short-term memory loss presented to the emergency room with left facial droop and L sided numbness. Pt is on anticoagulation for for history of DVT. States did not miss any doses.    MRI shows acute infarcts  in the [facility] territory ? cardio embolic in nature.    - Strong family history of strokes, DVTs - Hypercoagulable panel has been sent  - would obtain TEE if 2decho does not show any abnormalities.   - Con't anticoagulation - Pt/OT 04/07/2016, 12:14 PM

## 2016-04-07 NOTE — Progress Notes (Addendum)
[facility] [place] NAME: Tommy Daniels    MR#:  [id number]  DATE OF BIRTH:  01-Feb-1961  SUBJECTIVE:   Came in with left facial droop and left hand numbness REVIEW OF SYSTEMS:   Review of Systems  Constitutional: Negative for chills, fever and weight loss.  HENT: Negative for ear discharge, ear pain and nosebleeds.   Eyes: Negative for blurred vision, pain and discharge.  Respiratory: Negative for sputum production, shortness of breath, wheezing and stridor.   Cardiovascular: Negative for chest pain, palpitations, orthopnea and PND.  Gastrointestinal: Negative for abdominal pain, diarrhea, nausea and vomiting.  Genitourinary: Negative for frequency and urgency.  Musculoskeletal: Negative for back pain and joint pain.  Neurological: Positive for tingling. Negative for sensory change, speech change, focal weakness and weakness.  Psychiatric/Behavioral: Negative for depression and hallucinations. The patient is not nervous/anxious.    Tolerating Diet:yesTolerating PT: not needed  DRUG ALLERGIES:  No Known Allergies  VITALS:  Blood pressure 132/77, pulse 68, temperature 98.4 F (36.9 C), temperature source Oral, resp. rate 16, height 5\' 11"  (1.803 m), weight 111.6 kg (246 lb), SpO2 95 %.  PHYSICAL EXAMINATION:   Physical Exam  GENERAL:  55 y.o.-year-old patient lying in the bed with no acute distress.  EYES: Pupils equal, round, reactive to light and accommodation. No scleral icterus. Extraocular muscles intact.  HEENT: Head atraumatic, normocephalic. Oropharynx and nasopharynx clear.  NECK:  Supple, no jugular venous distention. No thyroid enlargement, no tenderness.  LUNGS: Normal breath sounds bilaterally, no wheezing, rales, rhonchi. No use of accessory muscles of respiration.  CARDIOVASCULAR: S1, S2 normal. No murmurs, rubs, or gallops.  ABDOMEN: Soft, nontender, nondistended. Bowel sounds present. No organomegaly or  mass.  EXTREMITIES: No cyanosis, clubbing or edema b/l.    NEUROLOGIC: Cranial nerves II through XII are intact. No focal Motor or sensory deficits b/l.   PSYCHIATRIC:  patient is alert and oriented x 3.  SKIN: No obvious rash, lesion, or ulcer.   LABORATORY PANEL:  CBC  Recent Labs Lab 04/06/16 2214  WBC 8.8  HGB 15.4  HCT 45.7  PLT 89*    Chemistries   Recent Labs Lab 04/06/16 2214  NA 139  K 3.8  CL 105  CO2 27  GLUCOSE 101*  BUN 20  CREATININE 1.76*  CALCIUM 9.1  AST 24  ALT 16*  ALKPHOS 47  BILITOT 0.6   Cardiac Enzymes  Recent Labs Lab 04/06/16 2214  TROPONINI <0.03   RADIOLOGY:  Dg Chest 2 View  Result Date: 04/07/2016 CLINICAL DATA:  Left-sided facial droop since last night with left hand numbness 1 year. EXAM: CHEST  2 VIEW COMPARISON:  None. FINDINGS: Lungs are adequately inflated without consolidation or effusion. Increased focal density in the suprahilar region on the lateral film. Cardiomediastinal silhouette is within normal. Remaining bones and soft tissues are unremarkable. IMPRESSION: No acute cardiopulmonary disease per Increased focal density over the suprahilar region on the lateral film as cannot exclude a central lung/hilar mass. Recommend follow-up chest radiograph 3 months versus contrast-enhanced chest CT on elective basis for further evaluation. Electronically Signed   By: Marin Olp M.D.   On: 04/07/2016 08:59   Ct Head Wo Contrast  Result Date: 04/06/2016 CLINICAL DATA:  Acute left facial droop prior to arrival. History of migraines. EXAM: CT HEAD WITHOUT CONTRAST TECHNIQUE: Contiguous axial images were obtained from the base of the skull through the vertex without intravenous contrast. COMPARISON:  None. FINDINGS:  BRAIN: The ventricles and sulci are normal. No intraparenchymal hemorrhage, mass effect nor midline shift. No acute large vascular territory infarcts. Grey-white matter distinction is maintained. The basal ganglia are  unremarkable. No abnormal extra-axial fluid collections. Basal cisterns are patent. The brainstem and cerebellar hemispheres are without acute abnormalities. VASCULAR: Unremarkable. SKULL/SOFT TISSUES: No skull fracture. No significant soft tissue swelling. ORBITS/SINUSES: The included ocular globes and orbital contents are normal.The mastoid air cells are clear. The included paranasal sinuses are well-aerated. OTHER: None. IMPRESSION: No acute intracranial abnormality. Critical Value/emergent results were called by telephone at the time of interpretation on 04/06/2016 at 10:09 pm to Dr. Harvest Dark , who verbally acknowledged these results. Electronically Signed   By: Ashley Royalty M.D.   On: 04/06/2016 22:11   Ct Chest Wo Contrast  Result Date: 04/07/2016 CLINICAL DATA:  Abnormal chest radiograph EXAM: CT CHEST WITHOUT CONTRAST TECHNIQUE: Multidetector CT imaging of the chest was performed following the standard protocol without IV contrast. COMPARISON:  None. FINDINGS: Cardiovascular: Minimal atherosclerotic calcification in the arch. No evidence of aortic aneurysm. Mediastinum/Nodes: No abnormal mediastinal adenopathy. Lungs/Pleura: No pneumothorax. Tiny pleural effusions. Minimal dependent atelectasis bilaterally. There is no evidence of a suprahilar mass. Upper Abdomen: Left renal cyst is partially imaged. Musculoskeletal: No vertebral compression deformity IMPRESSION: No acute cardiopulmonary disease. Specifically, there is no evidence of a left suprahilar lung mass. Electronically Signed   By: Marybelle Killings M.D.   On: 04/07/2016 13:26   Mr Brain Wo Contrast  Result Date: 04/07/2016 CLINICAL DATA:  Acute onset left facial droop EXAM: MRI HEAD WITHOUT CONTRAST TECHNIQUE: Multiplanar, multiecho pulse sequences of the brain and surrounding structures were obtained without intravenous contrast. COMPARISON:  Head CT 04/06/2016 Brain MRI 06/15/2015 FINDINGS: Brain: There are multiple small foci of  diffusion restriction within the right MCA distribution, predominantly within the right frontal lobe, at the base of the superior frontal and precentral gyri and at the peripheral aspect of the postcentral gyrus of the parietal lobe. Additionally, there is a punctate focus of diffusion restriction within the left occipital lobe. There is multifocal hyperintense T2-weighted signal within the periventricular white matter, most often seen in the setting of chronic microvascular ischemia. No mass lesion or midline shift. No hydrocephalus or extra-axial fluid collection. The midline structures are normal. No age advanced or lobar predominant atrophy. Vascular: Major intracranial arterial and venous sinus flow voids are preserved. No evidence of chronic microhemorrhage or amyloid angiopathy. Skull and upper cervical spine: The visualized skull base, calvarium, upper cervical spine and extracranial soft tissues are normal. Sinuses/Orbits: No fluid levels or advanced mucosal thickening. No mastoid effusion. Normal orbits. IMPRESSION: 1. Multiple small foci of acute ischemia, predominantly within the right MCA distribution, involving the right frontal and parietal lobes. Single contralateral focus of acute ischemia in the left occipital lobe. This distribution suggests a central cardioembolic process. 2. No hemorrhage or mass effect. Electronically Signed   By: Ulyses Jarred M.D.   On: 04/07/2016 00:32   US Carotid Bilateral (at [facility] Only)  Result Date: 04/07/2016 CLINICAL DATA:  Small right MCA territory embolic strokes. EXAM: BILATERAL CAROTID DUPLEX ULTRASOUND TECHNIQUE: Pearline Cables scale imaging, color Doppler and duplex ultrasound were performed of bilateral carotid and vertebral arteries in the neck. COMPARISON:  04/07/2016 FINDINGS: Criteria: Quantification of carotid stenosis is based on velocity parameters that correlate the residual internal carotid diameter with NASCET-based stenosis levels, using the  diameter of the distal internal carotid lumen as the denominator for stenosis measurement.  The following velocity measurements were obtained: RIGHT ICA:  76/27 cm/sec CCA:  99991111 cm/sec SYSTOLIC ICA/CCA RATIO:  0.8 DIASTOLIC ICA/CCA RATIO:  1.2 ECA:  93 cm/sec LEFT ICA:  68/25 cm/sec CCA:  0000000 cm/sec SYSTOLIC ICA/CCA RATIO:  0.8 DIASTOLIC ICA/CCA RATIO:  1.0 ECA:  92 cm/sec RIGHT CAROTID ARTERY: Minor intimal thickening. No significant atherosclerotic Plaque formation. No hemodynamically significant right ICA stenosis, velocity elevation, or turbulent flow. Degree of narrowing less than 50%. RIGHT VERTEBRAL ARTERY:  Antegrade LEFT CAROTID ARTERY: Similar scattered minor intimal thickening. No significant atherosclerotic Plaque formation. No hemodynamically significant left ICA stenosis, velocity elevation, or turbulent flow. LEFT VERTEBRAL ARTERY:  Antegrade IMPRESSION: Minor carotid intimal thickening. No significant carotid atherosclerosis or ICA stenosis. Degree of narrowing less than 50% bilaterally. Patent antegrade vertebral flow bilaterally Electronically Signed   By: Jerilynn Mages.  Shick M.D.   On: 04/07/2016 09:25   ASSESSMENT AND PLAN:  55 year old male patient with history of ocular migraine, irritable bowel syndrome, short-term memory loss presented to the emergency room with left upper extremity numbness and left facial droop.  1. Right MCA ischemic CVA possible embolic etiology -already on po eliquis(for DVT) -TEE in am. Spoke with dr Nehemiah Massed -Neurology consult with Dr Macie Burows appreciated -carotid doppler no stenosis -MRI brain positive for right MCA territory   2. History of ocular migraine  3. History of left leg DVT on po eliquis  4. Left hilar node/mass on CXR---CT chest negative  Case discussed with Care Management/Social Worker. Management plans discussed with the patient, family and they are in agreement.  CODE STATUS: full  DVT Prophylaxis: eliquis  TOTAL TIME TAKING CARE OF  THIS PATIENT: 30 minutes.  >50% time spent on counselling and coordination of care  POSSIBLE D/C IN1-2 DAYS, DEPENDING ON CLINICAL CONDITION.  Note: This dictation was prepared with Dragon dictation along with smaller phrase technology. Any transcriptional errors that result from this process are unintentional.  Analiya Porco M.D on 04/07/2016 at 1:35 PM  Between 7am to 6pm - Pager - [phone]  After 6pm go to [email or web address] - password [email or web address] [facility]  Office  [phone]  CC: Primary care physician; Kirk Ruths., MD

## 2016-04-07 NOTE — ED Notes (Signed)
Pt returned to room per [place], [place]

## 2016-04-07 NOTE — ED Provider Notes (Signed)
-----------------------------------------   1:55 AM on 04/07/2016 -----------------------------------------   Blood pressure (!) 142/79, pulse 74, temperature 98.5 F (36.9 C), temperature source Oral, resp. rate 15, height 5\' 11"  (1.803 m), weight 240 lb (108.9 kg), SpO2 98 %.  Assuming care from Dr. Kerman Passey.  In short, Tommy Daniels is a 55 y.o. male with a chief complaint of Code Stroke .  Refer to the original H&P for additional details.  The current plan of care is to follow up the results of the MRI.   MRI brain 1. Multiple small foci of acute ischemia, predominantly within the right MCA distribution, involving the right frontal and parietal lobes. Single contralateral focus of acute ischemia in the left occipital lobe. This distribution suggests a central cardioembolic process. 2. No hemorrhage or mass effect.  I will admit the patient to the hospitalist service for further evaluation.   Loney Hering, MD 04/07/16 [phone]

## 2016-04-08 ENCOUNTER — Inpatient Hospital Stay
Admit: 2016-04-08 | Discharge: 2016-04-08 | Disposition: A | Discharge status: Home / Self Care | Payer: 59 | Attending: Internal Medicine | Admitting: Internal Medicine

## 2016-04-08 ENCOUNTER — Encounter
Admission: EM | Disposition: A | Discharge status: Home / Self Care | Payer: Self-pay | Source: Home / Self Care | Attending: Internal Medicine

## 2016-04-08 ENCOUNTER — Encounter: Payer: Self-pay | Admitting: Internal Medicine

## 2016-04-08 HISTORY — PX: TEE WITHOUT CARDIOVERSION: SHX5443

## 2016-04-08 LAB — ECHOCARDIOGRAM COMPLETE
HEIGHTINCHES: 71 in
WEIGHTICAEL: 3936 [oz_av]

## 2016-04-08 LAB — HOMOCYSTEINE: Homocysteine: 10.7 umol/L (ref 0.0–15.0)

## 2016-04-08 SURGERY — ECHOCARDIOGRAM, TRANSESOPHAGEAL
Anesthesia: Moderate Sedation

## 2016-04-08 MED ORDER — SODIUM CHLORIDE FLUSH 0.9 % IV SOLN
INTRAVENOUS | Status: AC
  Filled 2016-04-08: qty 10

## 2016-04-08 MED ORDER — ASPIRIN 81 MG PO TABS: 81.0000 mg | ORAL_TABLET | Freq: Every day | ORAL | 0 refills | Status: AC

## 2016-04-08 MED ORDER — MIDAZOLAM HCL 2 MG/2ML IJ SOLN
INTRAMUSCULAR | Status: AC | PRN
  Administered 2016-04-08: 2 mg via INTRAVENOUS
  Administered 2016-04-08: 1 mg via INTRAVENOUS

## 2016-04-08 MED ORDER — BUTAMBEN-TETRACAINE-BENZOCAINE 2-2-14 % EX AERO
INHALATION_SPRAY | CUTANEOUS | Status: AC
  Filled 2016-04-08: qty 20

## 2016-04-08 MED ORDER — FENTANYL CITRATE (PF) 100 MCG/2ML IJ SOLN
INTRAMUSCULAR | Status: AC
  Filled 2016-04-08: qty 2

## 2016-04-08 MED ORDER — LIDOCAINE VISCOUS 2 % MT SOLN
OROMUCOSAL | Status: AC
  Filled 2016-04-08: qty 15

## 2016-04-08 MED ORDER — FENTANYL CITRATE (PF) 100 MCG/2ML IJ SOLN
INTRAMUSCULAR | Status: AC | PRN
  Administered 2016-04-08 (×2): 25 ug via INTRAVENOUS

## 2016-04-08 MED ORDER — MIDAZOLAM HCL 5 MG/5ML IJ SOLN
INTRAMUSCULAR | Status: AC
  Filled 2016-04-08: qty 5

## 2016-04-08 NOTE — Progress Notes (Signed)
Pt alert and oriented. No complaints of pain. NPO since MN for TEE today. NIH 1. Pt states swelling and numbness of left hand has improved since admission.

## 2016-04-08 NOTE — Evaluation (Signed)
SLP acreening: SLP conducted screening of speech and swallowing. Pt found to be [facility] and pt and family reports he is at baseline. No evaluation or treatment indicated at this time. RN notified.  Leafy Kindle, MA/CCC-SLP

## 2016-04-08 NOTE — OR Nursing (Signed)
Pt resting quietly. Awaiting Dr. Rana Snare warm and dry resp regular and unlabored. nad noted.

## 2016-04-08 NOTE — Progress Notes (Signed)
*  PRELIMINARY RESULTS* Echocardiogram Echocardiogram Transesophageal has been performed.  Sherrie Sport 04/08/2016, 9:17 AM

## 2016-04-08 NOTE — Progress Notes (Signed)
PT Cancellation Note  Patient Details Name: Tommy Daniels MRN: [id number] DOB: [birth year]   Cancelled Treatment:    Reason Eval/Treat Not Completed:  (Patient returned from procedure; discharge order noted.  Per primary RN, patient without mobility/PT needs prior to discharge.  Please re-contact/re-consult should needs change.)   Walther Sanagustin H. Owens Shark, PT, DPT, NCS 04/08/16, 12:39 PM [phone]

## 2016-04-08 NOTE — Progress Notes (Signed)
[place]    Aquiles Abresch was admitted to the Hospital on 04/06/2016 and Discharged  04/08/2016 and should be excused from work/school   for 9 days starting 04/06/2016 , may return to work/school without any restrictions.  Return to work jan 2nd  Call Fritzi Mandes MD, [facility]  [phone] with questions.  Sorah Falkenstein M.D on 04/08/2016,at 12:22 PM

## 2016-04-08 NOTE — Discharge Summary (Signed)
[facility] NAME: Tommy Daniels    MR#:  [id number]  DATE OF BIRTH:  10-09-1960  DATE OF ADMISSION:  04/06/2016 ADMITTING PHYSICIAN: Saundra Shelling, MD  DATE OF DISCHARGE: 04/08/16  PRIMARY CARE PHYSICIAN: Kirk Ruths., MD    ADMISSION DIAGNOSIS:  Cerebrovascular accident (CVA) due to embolism of middle cerebral artery, unspecified blood vessel laterality ([place]) [I63.419]  DISCHARGE DIAGNOSIS:  Acute right MCA territory CVA ?embolic/cryptogenic H/o DVT on eleiquis H/o Ocular migraine SECONDARY DIAGNOSIS:   Past Medical History:  Diagnosis Date  . IBS (irritable bowel syndrome)   . Ocular migraine   . Short-term memory loss     HOSPITAL COURSE:  55 year old male patient with history of ocular migraine, irritable bowel syndrome, short-term memory loss presented to the emergency room with left upper extremity numbness and left facial droop.  1.Right MCA ischemic CVA possible embolic etiology -already on po eliquis(for DVT) -TEE was essentially negative but per cardiology mild thickening of arotic valve cusp Spoke with dr Nehemiah Massed. F/u as out pt for holter monitor to r/o PAF -pt in [place] -Neurology consult with Dr Macie Burows appreciated -carotid doppler no stenosis -MRI brain positive for right MCA territory  -cont po eliquis -pt to f/u PCP or hematology in [place] as out pt for hypercoagulable panel  2.History of ocular migraine -on po propranolol for prophylaxis  3.History of left leg DVT on po eliquis  4. Left hilar node/mass on CXR---CT chest negative  D/c home today D/w pt and sisters CONSULTS OBTAINED:  Treatment Team:  Leotis Pain, MD  DRUG ALLERGIES:  No Known Allergies  DISCHARGE MEDICATIONS:   Current Discharge Medication List    START taking these medications   Details  aspirin 81 MG tablet Take 1 tablet (81 mg total) by mouth daily. Qty: 30 tablet, Refills: 0       CONTINUE these medications which have NOT CHANGED   Details  alfuzosin (UROXATRAL) 10 MG 24 hr tablet Take 10 mg by mouth daily.    clomiPHENE (CLOMID) 50 MG tablet Take 50 mg by mouth every other day.    diclofenac sodium (VOLTAREN) 1 % GEL Apply 4 g topically daily.    DULoxetine (CYMBALTA) 60 MG capsule Take 60 mg by mouth daily.    ELIQUIS 5 MG TABS tablet Take 5 mg by mouth 2 (two) times daily.    levothyroxine (SYNTHROID, LEVOTHROID) 75 MCG tablet Take 75 mcg by mouth daily before breakfast.    methylphenidate (RITALIN) 10 MG tablet Take 10 mg by mouth 2 (two) times daily.    propranolol (INDERAL) 40 MG tablet Take 40 mg by mouth 2 (two) times daily.    vitamin B-12 (CYANOCOBALAMIN) 1000 MCG tablet Take 1,000 mcg by mouth daily.    naproxen (NAPROSYN) 500 MG tablet Take 1 tablet (500 mg total) by mouth 2 (two) times daily with a meal. Qty: 20 tablet, Refills: 0        If you experience worsening of your admission symptoms, develop shortness of breath, life threatening emergency, suicidal or homicidal thoughts you must seek medical attention immediately by calling 911 or calling your MD immediately  if symptoms less severe.  You Must read complete instructions/literature along with all the possible adverse reactions/side effects for all the Medicines you take and that have been prescribed to you. Take any new Medicines after you have completely understood and accept all the possible adverse reactions/side effects.   Please note  You were  cared for by a hospitalist during your hospital stay. If you have any questions about your discharge medications or the care you received while you were in the hospital after you are discharged, you can call the unit and asked to speak with the hospitalist on call if the hospitalist that took care of you is not available. Once you are discharged, your primary care physician will handle any further medical issues. Please note that NO REFILLS for  any discharge medications will be authorized once you are discharged, as it is imperative that you return to your primary care physician (or establish a relationship with a primary care physician if you do not have one) for your aftercare needs so that they can reassess your need for medications and monitor your lab values. Today   SUBJECTIVE  No new complaints  VITAL SIGNS:  Blood pressure 132/79, pulse 73, temperature 98.5 F (36.9 C), resp. rate 13, height 5\' 11"  (1.803 m), weight 111.1 kg (245 lb), SpO2 93 %.  I/O:   Intake/Output Summary (Last 24 hours) at 04/08/16 1213 Last data filed at 04/07/16 1828  Gross per 24 hour  Intake              720 ml  Output                0 ml  Net              720 ml    PHYSICAL EXAMINATION:  GENERAL:  55 y.o.-year-old patient lying in the bed with no acute distress.  EYES: Pupils equal, round, reactive to light and accommodation. No scleral icterus. Extraocular muscles intact.  HEENT: Head atraumatic, normocephalic. Oropharynx and nasopharynx clear.  NECK:  Supple, no jugular venous distention. No thyroid enlargement, no tenderness.  LUNGS: Normal breath sounds bilaterally, no wheezing, rales,rhonchi or crepitation. No use of accessory muscles of respiration.  CARDIOVASCULAR: S1, S2 normal. No murmurs, rubs, or gallops.  ABDOMEN: Soft, non-tender, non-distended. Bowel sounds present. No organomegaly or mass.  EXTREMITIES: No pedal edema, cyanosis, or clubbing.  NEUROLOGIC: Cranial nerves II through XII are intact. Muscle strength 5/5 in all extremities. Sensation intact. Gait not checked.  PSYCHIATRIC: The patient is alert and oriented x 3.  SKIN: No obvious rash, lesion, or ulcer.   DATA REVIEW:   CBC   Recent Labs Lab 04/06/16 2214  WBC 8.8  HGB 15.4  HCT 45.7  PLT 89*    Chemistries   Recent Labs Lab 04/06/16 2214  NA 139  K 3.8  CL 105  CO2 27  GLUCOSE 101*  BUN 20  CREATININE 1.76*  CALCIUM 9.1  AST 24  ALT 16*   ALKPHOS 47  BILITOT 0.6    Microbiology Results   No results found for this or any previous visit (from the past 240 hour(s)).  RADIOLOGY:  Dg Chest 2 View  Result Date: 04/07/2016 CLINICAL DATA:  Left-sided facial droop since last night with left hand numbness 1 year. EXAM: CHEST  2 VIEW COMPARISON:  None. FINDINGS: Lungs are adequately inflated without consolidation or effusion. Increased focal density in the suprahilar region on the lateral film. Cardiomediastinal silhouette is within normal. Remaining bones and soft tissues are unremarkable. IMPRESSION: No acute cardiopulmonary disease per Increased focal density over the suprahilar region on the lateral film as cannot exclude a central lung/hilar mass. Recommend follow-up chest radiograph 3 months versus contrast-enhanced chest CT on elective basis for further evaluation. Electronically Signed   By: Marin Olp  M.D.   On: 04/07/2016 08:59   Ct Head Wo Contrast  Result Date: 04/06/2016 CLINICAL DATA:  Acute left facial droop prior to arrival. History of migraines. EXAM: CT HEAD WITHOUT CONTRAST TECHNIQUE: Contiguous axial images were obtained from the base of the skull through the vertex without intravenous contrast. COMPARISON:  None. FINDINGS: BRAIN: The ventricles and sulci are normal. No intraparenchymal hemorrhage, mass effect nor midline shift. No acute large vascular territory infarcts. Grey-white matter distinction is maintained. The basal ganglia are unremarkable. No abnormal extra-axial fluid collections. Basal cisterns are patent. The brainstem and cerebellar hemispheres are without acute abnormalities. VASCULAR: Unremarkable. SKULL/SOFT TISSUES: No skull fracture. No significant soft tissue swelling. ORBITS/SINUSES: The included ocular globes and orbital contents are normal.The mastoid air cells are clear. The included paranasal sinuses are well-aerated. OTHER: None. IMPRESSION: No acute intracranial abnormality. Critical  Value/emergent results were called by telephone at the time of interpretation on 04/06/2016 at 10:09 pm to Dr. Harvest Dark , who verbally acknowledged these results. Electronically Signed   By: Ashley Royalty M.D.   On: 04/06/2016 22:11   Ct Chest Wo Contrast  Result Date: 04/07/2016 CLINICAL DATA:  Abnormal chest radiograph EXAM: CT CHEST WITHOUT CONTRAST TECHNIQUE: Multidetector CT imaging of the chest was performed following the standard protocol without IV contrast. COMPARISON:  None. FINDINGS: Cardiovascular: Minimal atherosclerotic calcification in the arch. No evidence of aortic aneurysm. Mediastinum/Nodes: No abnormal mediastinal adenopathy. Lungs/Pleura: No pneumothorax. Tiny pleural effusions. Minimal dependent atelectasis bilaterally. There is no evidence of a suprahilar mass. Upper Abdomen: Left renal cyst is partially imaged. Musculoskeletal: No vertebral compression deformity IMPRESSION: No acute cardiopulmonary disease. Specifically, there is no evidence of a left suprahilar lung mass. Electronically Signed   By: Marybelle Killings M.D.   On: 04/07/2016 13:26   Mr Brain Wo Contrast  Result Date: 04/07/2016 CLINICAL DATA:  Acute onset left facial droop EXAM: MRI HEAD WITHOUT CONTRAST TECHNIQUE: Multiplanar, multiecho pulse sequences of the brain and surrounding structures were obtained without intravenous contrast. COMPARISON:  Head CT 04/06/2016 Brain MRI 06/15/2015 FINDINGS: Brain: There are multiple small foci of diffusion restriction within the right MCA distribution, predominantly within the right frontal lobe, at the base of the superior frontal and precentral gyri and at the peripheral aspect of the postcentral gyrus of the parietal lobe. Additionally, there is a punctate focus of diffusion restriction within the left occipital lobe. There is multifocal hyperintense T2-weighted signal within the periventricular white matter, most often seen in the setting of chronic microvascular  ischemia. No mass lesion or midline shift. No hydrocephalus or extra-axial fluid collection. The midline structures are normal. No age advanced or lobar predominant atrophy. Vascular: Major intracranial arterial and venous sinus flow voids are preserved. No evidence of chronic microhemorrhage or amyloid angiopathy. Skull and upper cervical spine: The visualized skull base, calvarium, upper cervical spine and extracranial soft tissues are normal. Sinuses/Orbits: No fluid levels or advanced mucosal thickening. No mastoid effusion. Normal orbits. IMPRESSION: 1. Multiple small foci of acute ischemia, predominantly within the right MCA distribution, involving the right frontal and parietal lobes. Single contralateral focus of acute ischemia in the left occipital lobe. This distribution suggests a central cardioembolic process. 2. No hemorrhage or mass effect. Electronically Signed   By: Ulyses Jarred M.D.   On: 04/07/2016 00:32   US Carotid Bilateral (at [facility] Only)  Result Date: 04/07/2016 CLINICAL DATA:  Small right MCA territory embolic strokes. EXAM: BILATERAL CAROTID DUPLEX ULTRASOUND TECHNIQUE: Pearline Cables scale imaging, color  Doppler and duplex ultrasound were performed of bilateral carotid and vertebral arteries in the neck. COMPARISON:  04/07/2016 FINDINGS: Criteria: Quantification of carotid stenosis is based on velocity parameters that correlate the residual internal carotid diameter with NASCET-based stenosis levels, using the diameter of the distal internal carotid lumen as the denominator for stenosis measurement. The following velocity measurements were obtained: RIGHT ICA:  76/27 cm/sec CCA:  99991111 cm/sec SYSTOLIC ICA/CCA RATIO:  0.8 DIASTOLIC ICA/CCA RATIO:  1.2 ECA:  93 cm/sec LEFT ICA:  68/25 cm/sec CCA:  0000000 cm/sec SYSTOLIC ICA/CCA RATIO:  0.8 DIASTOLIC ICA/CCA RATIO:  1.0 ECA:  92 cm/sec RIGHT CAROTID ARTERY: Minor intimal thickening. No significant atherosclerotic Plaque formation. No  hemodynamically significant right ICA stenosis, velocity elevation, or turbulent flow. Degree of narrowing less than 50%. RIGHT VERTEBRAL ARTERY:  Antegrade LEFT CAROTID ARTERY: Similar scattered minor intimal thickening. No significant atherosclerotic Plaque formation. No hemodynamically significant left ICA stenosis, velocity elevation, or turbulent flow. LEFT VERTEBRAL ARTERY:  Antegrade IMPRESSION: Minor carotid intimal thickening. No significant carotid atherosclerosis or ICA stenosis. Degree of narrowing less than 50% bilaterally. Patent antegrade vertebral flow bilaterally Electronically Signed   By: Jerilynn Mages.  Shick M.D.   On: 04/07/2016 09:25     Management plans discussed with the patient, family and they are in agreement.  CODE STATUS:     Code Status Orders        Start     Ordered   04/07/16 0537  Full code  Continuous     04/07/16 0536    Code Status History    Date Active Date Inactive Code Status Order ID Comments User Context   This patient has a current code status but no historical code status.    Advance Directive Documentation   Flowsheet Row Most Recent Value  Type of Advance Directive  Healthcare Power of Attorney, Living will  Pre-existing out of facility DNR order (yellow form or pink MOST form)  No data  "MOST" Form in Place?  No data      TOTAL TIME TAKING CARE OF THIS PATIENT: 40 minutes.    Torie Towle M.D on 04/08/2016 at 12:13 PM  Between 7am to 6pm - Pager - [phone] After 6pm go to [email or web address] - password [email or web address] [facility]  Office  [phone]  CC: Primary care physician; Kirk Ruths., MD

## 2016-04-08 NOTE — Progress Notes (Signed)
SN educated pt on discharge, pt verbalized understanding

## 2016-04-08 NOTE — Progress Notes (Signed)
PT Cancellation Note  Patient Details Name: AIDEN SCHARPF MRN: [id number] DOB: [birth year]   Cancelled Treatment:    Reason Eval/Treat Not Completed: Patient at procedure or test/unavailable (Consult received.  Patient currently off unit for diagnostic testing (TEE).  Will re-attempt at later time as patient available and medically appropriate.)   Mercer Peifer H. Owens Shark, PT, DPT, NCS 04/08/16, 8:05 AM [phone]

## 2016-04-08 NOTE — Consult Note (Signed)
Reason for Consult:L sided weakness  Referring Physician: Dr. Posey Pronto     Numbness improved and pt is close to baseline.  Past Medical History:  Diagnosis Date  . IBS (irritable bowel syndrome)   . Ocular migraine   . Short-term memory loss     Past Surgical History:  Procedure Laterality Date  . none      Family History  Problem Relation Age of Onset  . Heart disease Mother   . Heart disease Brother     Social History:  reports that he has never smoked. He has never used smokeless tobacco. He reports that he drinks about 1.8 - 2.4 oz of alcohol per week . He reports that he does not use drugs.  No Known Allergies  Medications: I have reviewed the patient's current medications.  ROS: History obtained from the patient  General ROS: negative for - chills, fatigue, fever, night sweats, weight gain or weight loss Psychological ROS: negative for - behavioral disorder, hallucinations, memory difficulties, mood swings or suicidal ideation Ophthalmic ROS: negative for - blurry vision, double vision, eye pain or loss of vision ENT ROS: negative for - epistaxis, nasal discharge, oral lesions, sore throat, tinnitus or vertigo Allergy and Immunology ROS: negative for - hives or itchy/watery eyes Hematological and Lymphatic ROS: negative for - bleeding problems, bruising or swollen lymph nodes Endocrine ROS: negative for - galactorrhea, hair pattern changes, polydipsia/polyuria or temperature intolerance Respiratory ROS: negative for - cough, hemoptysis, shortness of breath or wheezing Cardiovascular ROS: negative for - chest pain, dyspnea on exertion, edema or irregular heartbeat Gastrointestinal ROS: negative for - abdominal pain, diarrhea, hematemesis, nausea/vomiting or stool incontinence Genito-Urinary ROS: negative for - dysuria, hematuria, incontinence or urinary frequency/urgency Musculoskeletal ROS: negative for - joint swelling or muscular weakness Neurological ROS: as noted in  HPI Dermatological ROS: negative for rash and skin lesion changes  Physical Examination: Blood pressure 132/79, pulse 73, temperature 98.5 F (36.9 C), resp. rate 13, height 5\' 11"  (1.803 m), weight 111.1 kg (245 lb), SpO2 93 %.   Neurological Examination Mental Status: Alert, oriented, thought content appropriate.  Speech fluent without evidence of aphasia.  Able to follow 3 step commands without difficulty. Cranial Nerves: II: Discs flat bilaterally; Visual fields grossly normal, pupils equal, round, reactive to light and accommodation III,IV, VI: ptosis not present, extra-ocular motions intact bilaterally V,VII: smile symmetric, facial light touch sensation normal bilaterally VIII: hearing normal bilaterally IX,X: gag reflex present XI: bilateral shoulder shrug XII: midline tongue extension Motor: Right : Upper extremity   5/5    Left:     Upper extremity   5/5  Lower extremity   5/5     Lower extremity   5/5 Tone and bulk:normal tone throughout; no atrophy noted Sensory: LUE decreased sensation to light touch.  Deep Tendon Reflexes: 2+ and symmetric throughout Plantars: Right: downgoing   Left: downgoing Cerebellar: normal finger-to-nose, normal rapid alternating movements and normal heel-to-shin test Gait: normal gait and station      Laboratory Studies:   Basic Metabolic Panel:  Recent Labs Lab 04/06/16 2214  NA 139  K 3.8  CL 105  CO2 27  GLUCOSE 101*  BUN 20  CREATININE 1.76*  CALCIUM 9.1    Liver Function Tests:  Recent Labs Lab 04/06/16 2214  AST 24  ALT 16*  ALKPHOS 47  BILITOT 0.6  PROT 7.2  ALBUMIN 4.3   No results for input(s): LIPASE, AMYLASE in the last 168 hours. No results for input(s):  AMMONIA in the last 168 hours.  CBC:  Recent Labs Lab 04/06/16 2214  WBC 8.8  NEUTROABS 5.5  HGB 15.4  HCT 45.7  MCV 90.2  PLT 89*    Cardiac Enzymes:  Recent Labs Lab 04/06/16 2214  TROPONINI <0.03    BNP: Invalid input(s):  POCBNP  CBG: No results for input(s): GLUCAP in the last 168 hours.  Microbiology: No results found for this or any previous visit.  Coagulation Studies:  Recent Labs  04/06/16 2214  LABPROT 13.9  INR 1.07    Urinalysis:   Recent Labs Lab 04/06/16 2350  COLORURINE STRAW*  LABSPEC 1.005  PHURINE 5.0  GLUCOSEU NEGATIVE  HGBUR NEGATIVE  BILIRUBINUR NEGATIVE  KETONESUR NEGATIVE  PROTEINUR NEGATIVE  NITRITE NEGATIVE  LEUKOCYTESUR NEGATIVE    Lipid Panel:     Component Value Date/Time   CHOL 193 04/07/2016 0555   TRIG 87 04/07/2016 0555   HDL 44 04/07/2016 0555   CHOLHDL 4.4 04/07/2016 0555   VLDL 17 04/07/2016 0555   LDLCALC 132 (H) 04/07/2016 0555    HgbA1C: No results found for: HGBA1C  Urine Drug Screen:      Component Value Date/Time   LABOPIA NONE DETECTED 04/06/2016 2350   COCAINSCRNUR NONE DETECTED 04/06/2016 2350   LABBENZ NONE DETECTED 04/06/2016 2350   AMPHETMU NONE DETECTED 04/06/2016 2350   THCU NONE DETECTED 04/06/2016 2350   LABBARB NONE DETECTED 04/06/2016 2350    Alcohol Level:   Recent Labs Lab 04/06/16 2214  ETH <5    Other results: EKG: normal EKG, normal sinus rhythm, unchanged from previous tracings.  Imaging: Dg Chest 2 View  Result Date: 04/07/2016 CLINICAL DATA:  Left-sided facial droop since last night with left hand numbness 1 year. EXAM: CHEST  2 VIEW COMPARISON:  None. FINDINGS: Lungs are adequately inflated without consolidation or effusion. Increased focal density in the suprahilar region on the lateral film. Cardiomediastinal silhouette is within normal. Remaining bones and soft tissues are unremarkable. IMPRESSION: No acute cardiopulmonary disease per Increased focal density over the suprahilar region on the lateral film as cannot exclude a central lung/hilar mass. Recommend follow-up chest radiograph 3 months versus contrast-enhanced chest CT on elective basis for further evaluation. Electronically Signed   By: Marin Olp M.D.   On: 04/07/2016 08:59   Ct Head Wo Contrast  Result Date: 04/06/2016 CLINICAL DATA:  Acute left facial droop prior to arrival. History of migraines. EXAM: CT HEAD WITHOUT CONTRAST TECHNIQUE: Contiguous axial images were obtained from the base of the skull through the vertex without intravenous contrast. COMPARISON:  None. FINDINGS: BRAIN: The ventricles and sulci are normal. No intraparenchymal hemorrhage, mass effect nor midline shift. No acute large vascular territory infarcts. Grey-white matter distinction is maintained. The basal ganglia are unremarkable. No abnormal extra-axial fluid collections. Basal cisterns are patent. The brainstem and cerebellar hemispheres are without acute abnormalities. VASCULAR: Unremarkable. SKULL/SOFT TISSUES: No skull fracture. No significant soft tissue swelling. ORBITS/SINUSES: The included ocular globes and orbital contents are normal.The mastoid air cells are clear. The included paranasal sinuses are well-aerated. OTHER: None. IMPRESSION: No acute intracranial abnormality. Critical Value/emergent results were called by telephone at the time of interpretation on 04/06/2016 at 10:09 pm to Dr. Harvest Dark , who verbally acknowledged these results. Electronically Signed   By: Ashley Royalty M.D.   On: 04/06/2016 22:11   Ct Chest Wo Contrast  Result Date: 04/07/2016 CLINICAL DATA:  Abnormal chest radiograph EXAM: CT CHEST WITHOUT CONTRAST TECHNIQUE: Multidetector CT imaging  of the chest was performed following the standard protocol without IV contrast. COMPARISON:  None. FINDINGS: Cardiovascular: Minimal atherosclerotic calcification in the arch. No evidence of aortic aneurysm. Mediastinum/Nodes: No abnormal mediastinal adenopathy. Lungs/Pleura: No pneumothorax. Tiny pleural effusions. Minimal dependent atelectasis bilaterally. There is no evidence of a suprahilar mass. Upper Abdomen: Left renal cyst is partially imaged. Musculoskeletal: No vertebral  compression deformity IMPRESSION: No acute cardiopulmonary disease. Specifically, there is no evidence of a left suprahilar lung mass. Electronically Signed   By: Marybelle Killings M.D.   On: 04/07/2016 13:26   Mr Brain Wo Contrast  Result Date: 04/07/2016 CLINICAL DATA:  Acute onset left facial droop EXAM: MRI HEAD WITHOUT CONTRAST TECHNIQUE: Multiplanar, multiecho pulse sequences of the brain and surrounding structures were obtained without intravenous contrast. COMPARISON:  Head CT 04/06/2016 Brain MRI 06/15/2015 FINDINGS: Brain: There are multiple small foci of diffusion restriction within the right MCA distribution, predominantly within the right frontal lobe, at the base of the superior frontal and precentral gyri and at the peripheral aspect of the postcentral gyrus of the parietal lobe. Additionally, there is a punctate focus of diffusion restriction within the left occipital lobe. There is multifocal hyperintense T2-weighted signal within the periventricular white matter, most often seen in the setting of chronic microvascular ischemia. No mass lesion or midline shift. No hydrocephalus or extra-axial fluid collection. The midline structures are normal. No age advanced or lobar predominant atrophy. Vascular: Major intracranial arterial and venous sinus flow voids are preserved. No evidence of chronic microhemorrhage or amyloid angiopathy. Skull and upper cervical spine: The visualized skull base, calvarium, upper cervical spine and extracranial soft tissues are normal. Sinuses/Orbits: No fluid levels or advanced mucosal thickening. No mastoid effusion. Normal orbits. IMPRESSION: 1. Multiple small foci of acute ischemia, predominantly within the right MCA distribution, involving the right frontal and parietal lobes. Single contralateral focus of acute ischemia in the left occipital lobe. This distribution suggests a central cardioembolic process. 2. No hemorrhage or mass effect. Electronically Signed   By:  Ulyses Jarred M.D.   On: 04/07/2016 00:32   US Carotid Bilateral (at [facility] Only)  Result Date: 04/07/2016 CLINICAL DATA:  Small right MCA territory embolic strokes. EXAM: BILATERAL CAROTID DUPLEX ULTRASOUND TECHNIQUE: Pearline Cables scale imaging, color Doppler and duplex ultrasound were performed of bilateral carotid and vertebral arteries in the neck. COMPARISON:  04/07/2016 FINDINGS: Criteria: Quantification of carotid stenosis is based on velocity parameters that correlate the residual internal carotid diameter with NASCET-based stenosis levels, using the diameter of the distal internal carotid lumen as the denominator for stenosis measurement. The following velocity measurements were obtained: RIGHT ICA:  76/27 cm/sec CCA:  99991111 cm/sec SYSTOLIC ICA/CCA RATIO:  0.8 DIASTOLIC ICA/CCA RATIO:  1.2 ECA:  93 cm/sec LEFT ICA:  68/25 cm/sec CCA:  0000000 cm/sec SYSTOLIC ICA/CCA RATIO:  0.8 DIASTOLIC ICA/CCA RATIO:  1.0 ECA:  92 cm/sec RIGHT CAROTID ARTERY: Minor intimal thickening. No significant atherosclerotic Plaque formation. No hemodynamically significant right ICA stenosis, velocity elevation, or turbulent flow. Degree of narrowing less than 50%. RIGHT VERTEBRAL ARTERY:  Antegrade LEFT CAROTID ARTERY: Similar scattered minor intimal thickening. No significant atherosclerotic Plaque formation. No hemodynamically significant left ICA stenosis, velocity elevation, or turbulent flow. LEFT VERTEBRAL ARTERY:  Antegrade IMPRESSION: Minor carotid intimal thickening. No significant carotid atherosclerosis or ICA stenosis. Degree of narrowing less than 50% bilaterally. Patent antegrade vertebral flow bilaterally Electronically Signed   By: Jerilynn Mages.  Shick M.D.   On: 04/07/2016 09:25  Assessment/Plan:   55 y.o. male  with a known history of Ocular migraine, irritable bowel syndrome, short-term memory loss presented to the emergency room with left facial droop and L sided numbness. Pt is on anticoagulation for for history  of DVT. States did not miss any doses.    MRI shows acute infarcts in the [facility] territory ? cardio embolic in nature.   Numbness improved this AM and pt is close to baseline  - s/p TEE today  - Likely d/c planning and con't anticoagulation as prior - Hypercoagulable work up should be available in the next 10-14 days.    04/08/2016, 9:59 AM

## 2016-04-09 LAB — PROTEIN S, TOTAL: Protein S Ag, Total: 57 % — ABNORMAL LOW (ref 60–150)

## 2016-04-09 LAB — CARDIOLIPIN ANTIBODIES, IGG, IGM, IGA
ANTICARDIOLIPIN IGA: 11 U/mL (ref 0–11)
ANTICARDIOLIPIN IGM: 9 [MPL'U]/mL (ref 0–12)
Anticardiolipin IgG: 123 GPL U/mL — ABNORMAL HIGH (ref 0–14)

## 2016-04-09 LAB — BETA-2-GLYCOPROTEIN I ABS, IGG/M/A
BETA-2-GLYCOPROTEIN I IGA: 15 GPI IgA units (ref 0–25)
BETA-2-GLYCOPROTEIN I IGM: 10 GPI IgM units (ref 0–32)

## 2016-04-09 LAB — PROTEIN S ACTIVITY: Protein S Activity: 111 % (ref 63–140)

## 2016-04-09 LAB — PROTEIN C ACTIVITY: Protein C Activity: 112 % (ref 73–180)

## 2016-04-09 LAB — PROTEIN C, TOTAL: PROTEIN C, TOTAL: 96 % (ref 60–150)

## 2016-04-11 LAB — PROTHROMBIN GENE MUTATION

## 2016-04-15 DIAGNOSIS — G43109 Migraine with aura, not intractable, without status migrainosus: Secondary | ICD-10-CM | POA: Diagnosis not present

## 2016-04-15 DIAGNOSIS — I639 Cerebral infarction, unspecified: Secondary | ICD-10-CM | POA: Diagnosis not present

## 2016-04-15 DIAGNOSIS — R5383 Other fatigue: Secondary | ICD-10-CM | POA: Diagnosis not present

## 2016-04-15 LAB — FACTOR 5 LEIDEN

## 2016-04-15 LAB — LUPUS ANTICOAGULANT PANEL
DRVVT: 140 s — AB (ref 0.0–47.0)
PTT LA: 43.6 s (ref 0.0–51.9)

## 2016-04-15 LAB — DRVVT CONFIRM: dRVVT Confirm: 2.9 ratio — ABNORMAL HIGH (ref 0.8–1.2)

## 2016-04-15 LAB — DRVVT MIX: dRVVT Mix: 70.6 s — ABNORMAL HIGH (ref 0.0–47.0)

## 2016-04-16 DIAGNOSIS — I639 Cerebral infarction, unspecified: Secondary | ICD-10-CM | POA: Diagnosis not present

## 2016-04-16 DIAGNOSIS — D6859 Other primary thrombophilia: Secondary | ICD-10-CM | POA: Diagnosis not present

## 2016-04-18 DIAGNOSIS — I639 Cerebral infarction, unspecified: Secondary | ICD-10-CM | POA: Diagnosis not present

## 2016-04-23 DIAGNOSIS — I82512 Chronic embolism and thrombosis of left femoral vein: Secondary | ICD-10-CM | POA: Diagnosis not present

## 2016-04-23 DIAGNOSIS — D6861 Antiphospholipid syndrome: Secondary | ICD-10-CM | POA: Diagnosis not present

## 2016-04-23 DIAGNOSIS — I639 Cerebral infarction, unspecified: Secondary | ICD-10-CM | POA: Diagnosis not present

## 2016-04-29 DIAGNOSIS — G43011 Migraine without aura, intractable, with status migrainosus: Secondary | ICD-10-CM | POA: Diagnosis not present

## 2016-05-08 DIAGNOSIS — D225 Melanocytic nevi of trunk: Secondary | ICD-10-CM | POA: Diagnosis not present

## 2016-05-08 DIAGNOSIS — L219 Seborrheic dermatitis, unspecified: Secondary | ICD-10-CM | POA: Diagnosis not present

## 2016-05-08 DIAGNOSIS — L820 Inflamed seborrheic keratosis: Secondary | ICD-10-CM | POA: Diagnosis not present

## 2016-05-22 DIAGNOSIS — G43011 Migraine without aura, intractable, with status migrainosus: Secondary | ICD-10-CM | POA: Diagnosis not present

## 2016-05-22 DIAGNOSIS — R400 Somnolence: Secondary | ICD-10-CM | POA: Diagnosis not present

## 2016-06-13 DIAGNOSIS — R768 Other specified abnormal immunological findings in serum: Secondary | ICD-10-CM | POA: Diagnosis not present

## 2016-06-13 DIAGNOSIS — I359 Nonrheumatic aortic valve disorder, unspecified: Secondary | ICD-10-CM | POA: Diagnosis not present

## 2016-06-13 DIAGNOSIS — D6861 Antiphospholipid syndrome: Secondary | ICD-10-CM | POA: Diagnosis not present

## 2016-06-19 DIAGNOSIS — G4733 Obstructive sleep apnea (adult) (pediatric): Secondary | ICD-10-CM | POA: Diagnosis not present

## 2016-06-20 DIAGNOSIS — E538 Deficiency of other specified B group vitamins: Secondary | ICD-10-CM | POA: Diagnosis not present

## 2016-06-20 DIAGNOSIS — G43109 Migraine with aura, not intractable, without status migrainosus: Secondary | ICD-10-CM | POA: Diagnosis not present

## 2016-06-20 DIAGNOSIS — R413 Other amnesia: Secondary | ICD-10-CM | POA: Diagnosis not present

## 2016-06-24 ENCOUNTER — Other Ambulatory Visit: Payer: Self-pay | Admitting: Neurology

## 2016-06-24 DIAGNOSIS — I69359 Hemiplegia and hemiparesis following cerebral infarction affecting unspecified side: Secondary | ICD-10-CM

## 2016-07-01 DIAGNOSIS — N401 Enlarged prostate with lower urinary tract symptoms: Secondary | ICD-10-CM | POA: Diagnosis not present

## 2016-07-01 DIAGNOSIS — R3912 Poor urinary stream: Secondary | ICD-10-CM | POA: Diagnosis not present

## 2016-07-01 DIAGNOSIS — E291 Testicular hypofunction: Secondary | ICD-10-CM | POA: Diagnosis not present

## 2016-07-02 DIAGNOSIS — I359 Nonrheumatic aortic valve disorder, unspecified: Secondary | ICD-10-CM | POA: Diagnosis not present

## 2016-07-02 DIAGNOSIS — I639 Cerebral infarction, unspecified: Secondary | ICD-10-CM | POA: Diagnosis not present

## 2016-07-08 ENCOUNTER — Ambulatory Visit
Admission: RE | Admit: 2016-07-08 | Discharge: 2016-07-08 | Disposition: A | Discharge status: Home / Self Care | Payer: 59 | Source: Ambulatory Visit | Attending: Neurology | Admitting: Neurology

## 2016-07-09 DIAGNOSIS — G43011 Migraine without aura, intractable, with status migrainosus: Secondary | ICD-10-CM | POA: Diagnosis not present

## 2016-07-09 DIAGNOSIS — G4733 Obstructive sleep apnea (adult) (pediatric): Secondary | ICD-10-CM | POA: Diagnosis not present

## 2016-07-10 DIAGNOSIS — I639 Cerebral infarction, unspecified: Secondary | ICD-10-CM | POA: Diagnosis not present

## 2016-08-05 DIAGNOSIS — E039 Hypothyroidism, unspecified: Secondary | ICD-10-CM | POA: Diagnosis not present

## 2016-08-05 DIAGNOSIS — D6861 Antiphospholipid syndrome: Secondary | ICD-10-CM | POA: Diagnosis not present

## 2016-08-05 DIAGNOSIS — I82512 Chronic embolism and thrombosis of left femoral vein: Secondary | ICD-10-CM | POA: Diagnosis not present

## 2016-08-06 DIAGNOSIS — G43409 Hemiplegic migraine, not intractable, without status migrainosus: Secondary | ICD-10-CM | POA: Diagnosis not present

## 2016-08-06 DIAGNOSIS — G4733 Obstructive sleep apnea (adult) (pediatric): Secondary | ICD-10-CM | POA: Diagnosis not present

## 2016-08-06 DIAGNOSIS — I639 Cerebral infarction, unspecified: Secondary | ICD-10-CM | POA: Diagnosis not present

## 2016-08-11 DIAGNOSIS — G4733 Obstructive sleep apnea (adult) (pediatric): Secondary | ICD-10-CM | POA: Diagnosis not present

## 2016-08-19 DIAGNOSIS — I639 Cerebral infarction, unspecified: Secondary | ICD-10-CM | POA: Diagnosis not present

## 2016-08-19 DIAGNOSIS — R413 Other amnesia: Secondary | ICD-10-CM | POA: Diagnosis not present

## 2016-09-02 DIAGNOSIS — G4733 Obstructive sleep apnea (adult) (pediatric): Secondary | ICD-10-CM | POA: Diagnosis not present

## 2016-09-03 ENCOUNTER — Ambulatory Visit
Admission: RE | Admit: 2016-09-03 | Discharge: 2016-09-03 | Disposition: A | Discharge status: Home / Self Care | Payer: 59 | Source: Ambulatory Visit | Attending: Neurology | Admitting: Neurology

## 2016-09-03 DIAGNOSIS — I639 Cerebral infarction, unspecified: Secondary | ICD-10-CM | POA: Diagnosis not present

## 2016-09-03 DIAGNOSIS — I69359 Hemiplegia and hemiparesis following cerebral infarction affecting unspecified side: Secondary | ICD-10-CM | POA: Diagnosis not present

## 2016-09-03 MED ORDER — GADOBENATE DIMEGLUMINE 529 MG/ML IV SOLN
20.0000 mL | Freq: Once | INTRAVENOUS | Status: AC | PRN
  Administered 2016-09-03: 20 mL via INTRAVENOUS

## 2016-09-04 DIAGNOSIS — I639 Cerebral infarction, unspecified: Secondary | ICD-10-CM | POA: Diagnosis not present

## 2016-09-04 DIAGNOSIS — G3184 Mild cognitive impairment, so stated: Secondary | ICD-10-CM | POA: Diagnosis not present

## 2016-09-05 DIAGNOSIS — R768 Other specified abnormal immunological findings in serum: Secondary | ICD-10-CM | POA: Diagnosis not present

## 2016-09-05 DIAGNOSIS — D6861 Antiphospholipid syndrome: Secondary | ICD-10-CM | POA: Diagnosis not present

## 2016-09-05 DIAGNOSIS — E039 Hypothyroidism, unspecified: Secondary | ICD-10-CM | POA: Diagnosis not present

## 2016-09-11 DIAGNOSIS — G43011 Migraine without aura, intractable, with status migrainosus: Secondary | ICD-10-CM | POA: Diagnosis not present

## 2016-09-22 DIAGNOSIS — G4733 Obstructive sleep apnea (adult) (pediatric): Secondary | ICD-10-CM | POA: Diagnosis not present

## 2016-10-10 DIAGNOSIS — E538 Deficiency of other specified B group vitamins: Secondary | ICD-10-CM | POA: Diagnosis not present

## 2016-10-10 DIAGNOSIS — R413 Other amnesia: Secondary | ICD-10-CM | POA: Diagnosis not present

## 2016-10-10 DIAGNOSIS — G43109 Migraine with aura, not intractable, without status migrainosus: Secondary | ICD-10-CM | POA: Diagnosis not present

## 2016-10-20 ENCOUNTER — Ambulatory Visit
Admission: RE | Admit: 2016-10-20 | Discharge: 2016-10-20 | Disposition: A | Discharge status: Home / Self Care | Payer: 59 | Source: Ambulatory Visit | Attending: Internal Medicine | Admitting: Internal Medicine

## 2016-10-20 ENCOUNTER — Other Ambulatory Visit: Payer: Self-pay | Admitting: Internal Medicine

## 2016-10-20 DIAGNOSIS — D6861 Antiphospholipid syndrome: Secondary | ICD-10-CM | POA: Diagnosis not present

## 2016-10-20 DIAGNOSIS — M7989 Other specified soft tissue disorders: Secondary | ICD-10-CM | POA: Diagnosis present

## 2016-10-20 DIAGNOSIS — M79661 Pain in right lower leg: Secondary | ICD-10-CM

## 2016-10-20 DIAGNOSIS — E039 Hypothyroidism, unspecified: Secondary | ICD-10-CM | POA: Diagnosis not present

## 2016-10-20 DIAGNOSIS — I82512 Chronic embolism and thrombosis of left femoral vein: Secondary | ICD-10-CM | POA: Diagnosis not present

## 2016-10-23 DIAGNOSIS — G4733 Obstructive sleep apnea (adult) (pediatric): Secondary | ICD-10-CM | POA: Diagnosis not present

## 2016-10-28 DIAGNOSIS — E291 Testicular hypofunction: Secondary | ICD-10-CM | POA: Diagnosis not present

## 2016-11-04 DIAGNOSIS — N401 Enlarged prostate with lower urinary tract symptoms: Secondary | ICD-10-CM | POA: Diagnosis not present

## 2016-11-04 DIAGNOSIS — E291 Testicular hypofunction: Secondary | ICD-10-CM | POA: Diagnosis not present

## 2016-11-04 DIAGNOSIS — R3912 Poor urinary stream: Secondary | ICD-10-CM | POA: Diagnosis not present

## 2016-11-14 DIAGNOSIS — G4733 Obstructive sleep apnea (adult) (pediatric): Secondary | ICD-10-CM | POA: Diagnosis not present

## 2016-11-14 DIAGNOSIS — E291 Testicular hypofunction: Secondary | ICD-10-CM | POA: Diagnosis not present

## 2016-11-14 DIAGNOSIS — D6861 Antiphospholipid syndrome: Secondary | ICD-10-CM | POA: Diagnosis not present

## 2016-12-02 DIAGNOSIS — R5383 Other fatigue: Secondary | ICD-10-CM | POA: Diagnosis not present

## 2016-12-12 DIAGNOSIS — Z7689 Persons encountering health services in other specified circumstances: Secondary | ICD-10-CM | POA: Diagnosis not present

## 2016-12-29 DIAGNOSIS — R002 Palpitations: Secondary | ICD-10-CM | POA: Diagnosis not present

## 2016-12-29 DIAGNOSIS — G4733 Obstructive sleep apnea (adult) (pediatric): Secondary | ICD-10-CM | POA: Diagnosis not present

## 2016-12-29 DIAGNOSIS — E782 Mixed hyperlipidemia: Secondary | ICD-10-CM | POA: Diagnosis not present

## 2017-01-27 DIAGNOSIS — G43409 Hemiplegic migraine, not intractable, without status migrainosus: Secondary | ICD-10-CM | POA: Diagnosis not present

## 2017-01-27 DIAGNOSIS — G4733 Obstructive sleep apnea (adult) (pediatric): Secondary | ICD-10-CM | POA: Diagnosis not present

## 2017-01-27 DIAGNOSIS — I639 Cerebral infarction, unspecified: Secondary | ICD-10-CM | POA: Diagnosis not present

## 2017-01-28 DIAGNOSIS — H43393 Other vitreous opacities, bilateral: Secondary | ICD-10-CM | POA: Diagnosis not present

## 2017-02-02 DIAGNOSIS — E039 Hypothyroidism, unspecified: Secondary | ICD-10-CM | POA: Diagnosis not present

## 2017-02-02 DIAGNOSIS — D6861 Antiphospholipid syndrome: Secondary | ICD-10-CM | POA: Diagnosis not present

## 2017-02-02 DIAGNOSIS — I82512 Chronic embolism and thrombosis of left femoral vein: Secondary | ICD-10-CM | POA: Diagnosis not present

## 2017-02-03 DIAGNOSIS — N401 Enlarged prostate with lower urinary tract symptoms: Secondary | ICD-10-CM | POA: Diagnosis not present

## 2017-02-03 DIAGNOSIS — E291 Testicular hypofunction: Secondary | ICD-10-CM | POA: Diagnosis not present

## 2017-02-03 DIAGNOSIS — R3912 Poor urinary stream: Secondary | ICD-10-CM | POA: Diagnosis not present

## 2017-02-09 DIAGNOSIS — Z23 Encounter for immunization: Secondary | ICD-10-CM | POA: Diagnosis not present

## 2017-02-09 DIAGNOSIS — E039 Hypothyroidism, unspecified: Secondary | ICD-10-CM | POA: Diagnosis not present

## 2017-02-09 DIAGNOSIS — D6861 Antiphospholipid syndrome: Secondary | ICD-10-CM | POA: Diagnosis not present

## 2017-02-09 DIAGNOSIS — Z0000 Encounter for general adult medical examination without abnormal findings: Secondary | ICD-10-CM | POA: Diagnosis not present

## 2017-02-12 DIAGNOSIS — Z7689 Persons encountering health services in other specified circumstances: Secondary | ICD-10-CM | POA: Diagnosis not present

## 2017-02-26 DIAGNOSIS — N401 Enlarged prostate with lower urinary tract symptoms: Secondary | ICD-10-CM | POA: Diagnosis not present

## 2017-02-26 DIAGNOSIS — E291 Testicular hypofunction: Secondary | ICD-10-CM | POA: Diagnosis not present

## 2017-03-18 DIAGNOSIS — D485 Neoplasm of uncertain behavior of skin: Secondary | ICD-10-CM | POA: Diagnosis not present

## 2017-03-18 DIAGNOSIS — D224 Melanocytic nevi of scalp and neck: Secondary | ICD-10-CM | POA: Diagnosis not present

## 2017-03-18 DIAGNOSIS — D2230 Melanocytic nevi of unspecified part of face: Secondary | ICD-10-CM | POA: Diagnosis not present

## 2017-03-18 DIAGNOSIS — D225 Melanocytic nevi of trunk: Secondary | ICD-10-CM | POA: Diagnosis not present

## 2017-03-20 DIAGNOSIS — I82512 Chronic embolism and thrombosis of left femoral vein: Secondary | ICD-10-CM | POA: Diagnosis not present

## 2017-03-20 DIAGNOSIS — D6861 Antiphospholipid syndrome: Secondary | ICD-10-CM | POA: Diagnosis not present

## 2017-04-10 DIAGNOSIS — E039 Hypothyroidism, unspecified: Secondary | ICD-10-CM | POA: Diagnosis not present

## 2017-04-13 DIAGNOSIS — G4733 Obstructive sleep apnea (adult) (pediatric): Secondary | ICD-10-CM | POA: Diagnosis not present

## 2017-04-20 DIAGNOSIS — D0359 Melanoma in situ of other part of trunk: Secondary | ICD-10-CM | POA: Diagnosis not present

## 2017-04-20 DIAGNOSIS — C4359 Malignant melanoma of other part of trunk: Secondary | ICD-10-CM | POA: Diagnosis not present

## 2017-05-12 DIAGNOSIS — G43409 Hemiplegic migraine, not intractable, without status migrainosus: Secondary | ICD-10-CM | POA: Diagnosis not present

## 2017-05-12 DIAGNOSIS — G4733 Obstructive sleep apnea (adult) (pediatric): Secondary | ICD-10-CM | POA: Diagnosis not present

## 2017-05-12 DIAGNOSIS — I639 Cerebral infarction, unspecified: Secondary | ICD-10-CM | POA: Diagnosis not present

## 2017-06-08 DIAGNOSIS — R202 Paresthesia of skin: Secondary | ICD-10-CM | POA: Diagnosis not present

## 2017-06-08 DIAGNOSIS — Z8673 Personal history of transient ischemic attack (TIA), and cerebral infarction without residual deficits: Secondary | ICD-10-CM | POA: Diagnosis not present

## 2017-06-08 DIAGNOSIS — E538 Deficiency of other specified B group vitamins: Secondary | ICD-10-CM | POA: Diagnosis not present

## 2017-06-29 DIAGNOSIS — H547 Unspecified visual loss: Secondary | ICD-10-CM | POA: Diagnosis not present

## 2017-06-30 DIAGNOSIS — H547 Unspecified visual loss: Secondary | ICD-10-CM | POA: Diagnosis not present

## 2017-07-03 DIAGNOSIS — I82512 Chronic embolism and thrombosis of left femoral vein: Secondary | ICD-10-CM | POA: Diagnosis not present

## 2017-07-03 DIAGNOSIS — D6861 Antiphospholipid syndrome: Secondary | ICD-10-CM | POA: Diagnosis not present

## 2017-07-09 ENCOUNTER — Other Ambulatory Visit: Payer: Self-pay | Admitting: Internal Medicine

## 2017-07-09 DIAGNOSIS — N183 Chronic kidney disease, stage 3 unspecified: Secondary | ICD-10-CM

## 2017-07-09 DIAGNOSIS — I129 Hypertensive chronic kidney disease with stage 1 through stage 4 chronic kidney disease, or unspecified chronic kidney disease: Secondary | ICD-10-CM | POA: Diagnosis not present

## 2017-07-15 DIAGNOSIS — D6861 Antiphospholipid syndrome: Secondary | ICD-10-CM | POA: Diagnosis not present

## 2017-07-17 ENCOUNTER — Ambulatory Visit
Admission: RE | Admit: 2017-07-17 | Discharge: 2017-07-17 | Disposition: A | Discharge status: Home / Self Care | Payer: 59 | Source: Ambulatory Visit | Attending: Internal Medicine | Admitting: Internal Medicine

## 2017-07-17 DIAGNOSIS — N281 Cyst of kidney, acquired: Secondary | ICD-10-CM | POA: Diagnosis not present

## 2017-07-17 DIAGNOSIS — N183 Chronic kidney disease, stage 3 unspecified: Secondary | ICD-10-CM

## 2017-07-17 DIAGNOSIS — I129 Hypertensive chronic kidney disease with stage 1 through stage 4 chronic kidney disease, or unspecified chronic kidney disease: Secondary | ICD-10-CM | POA: Insufficient documentation

## 2017-08-04 DIAGNOSIS — G43409 Hemiplegic migraine, not intractable, without status migrainosus: Secondary | ICD-10-CM | POA: Diagnosis not present

## 2017-08-04 DIAGNOSIS — D6861 Antiphospholipid syndrome: Secondary | ICD-10-CM | POA: Diagnosis not present

## 2017-08-04 DIAGNOSIS — I639 Cerebral infarction, unspecified: Secondary | ICD-10-CM | POA: Diagnosis not present

## 2017-08-10 DIAGNOSIS — I129 Hypertensive chronic kidney disease with stage 1 through stage 4 chronic kidney disease, or unspecified chronic kidney disease: Secondary | ICD-10-CM | POA: Diagnosis not present

## 2017-08-10 DIAGNOSIS — D6861 Antiphospholipid syndrome: Secondary | ICD-10-CM | POA: Diagnosis not present

## 2017-08-10 DIAGNOSIS — E039 Hypothyroidism, unspecified: Secondary | ICD-10-CM | POA: Diagnosis not present

## 2017-08-12 DIAGNOSIS — I82512 Chronic embolism and thrombosis of left femoral vein: Secondary | ICD-10-CM | POA: Diagnosis not present

## 2017-08-12 DIAGNOSIS — H547 Unspecified visual loss: Secondary | ICD-10-CM | POA: Diagnosis not present

## 2017-08-17 DIAGNOSIS — I82512 Chronic embolism and thrombosis of left femoral vein: Secondary | ICD-10-CM | POA: Diagnosis not present

## 2017-08-21 DIAGNOSIS — R791 Abnormal coagulation profile: Secondary | ICD-10-CM | POA: Diagnosis not present

## 2017-08-31 DIAGNOSIS — R791 Abnormal coagulation profile: Secondary | ICD-10-CM | POA: Diagnosis not present

## 2017-09-08 DIAGNOSIS — R791 Abnormal coagulation profile: Secondary | ICD-10-CM | POA: Diagnosis not present

## 2017-09-16 DIAGNOSIS — I82512 Chronic embolism and thrombosis of left femoral vein: Secondary | ICD-10-CM | POA: Diagnosis not present

## 2017-09-18 DIAGNOSIS — N183 Chronic kidney disease, stage 3 (moderate): Secondary | ICD-10-CM | POA: Diagnosis not present

## 2017-09-18 DIAGNOSIS — I10 Essential (primary) hypertension: Secondary | ICD-10-CM | POA: Diagnosis not present

## 2017-09-30 DIAGNOSIS — R791 Abnormal coagulation profile: Secondary | ICD-10-CM | POA: Diagnosis not present

## 2017-10-16 DIAGNOSIS — D6861 Antiphospholipid syndrome: Secondary | ICD-10-CM | POA: Diagnosis not present

## 2017-10-16 DIAGNOSIS — I10 Essential (primary) hypertension: Secondary | ICD-10-CM | POA: Diagnosis not present

## 2017-10-16 DIAGNOSIS — N183 Chronic kidney disease, stage 3 (moderate): Secondary | ICD-10-CM | POA: Diagnosis not present

## 2017-10-23 DIAGNOSIS — R05 Cough: Secondary | ICD-10-CM | POA: Diagnosis not present

## 2017-10-23 DIAGNOSIS — G4733 Obstructive sleep apnea (adult) (pediatric): Secondary | ICD-10-CM | POA: Diagnosis not present

## 2017-10-28 DIAGNOSIS — I82512 Chronic embolism and thrombosis of left femoral vein: Secondary | ICD-10-CM | POA: Diagnosis not present

## 2017-10-29 DIAGNOSIS — D224 Melanocytic nevi of scalp and neck: Secondary | ICD-10-CM | POA: Diagnosis not present

## 2017-10-29 DIAGNOSIS — D2230 Melanocytic nevi of unspecified part of face: Secondary | ICD-10-CM | POA: Diagnosis not present

## 2017-10-29 DIAGNOSIS — L219 Seborrheic dermatitis, unspecified: Secondary | ICD-10-CM | POA: Diagnosis not present

## 2017-11-05 DIAGNOSIS — R791 Abnormal coagulation profile: Secondary | ICD-10-CM | POA: Diagnosis not present

## 2017-11-19 DIAGNOSIS — N401 Enlarged prostate with lower urinary tract symptoms: Secondary | ICD-10-CM | POA: Diagnosis not present

## 2017-11-19 DIAGNOSIS — R3912 Poor urinary stream: Secondary | ICD-10-CM | POA: Diagnosis not present

## 2017-11-20 DIAGNOSIS — R791 Abnormal coagulation profile: Secondary | ICD-10-CM | POA: Diagnosis not present

## 2017-12-08 DIAGNOSIS — I639 Cerebral infarction, unspecified: Secondary | ICD-10-CM | POA: Diagnosis not present

## 2017-12-08 DIAGNOSIS — G43011 Migraine without aura, intractable, with status migrainosus: Secondary | ICD-10-CM | POA: Diagnosis not present

## 2017-12-08 DIAGNOSIS — G43409 Hemiplegic migraine, not intractable, without status migrainosus: Secondary | ICD-10-CM | POA: Diagnosis not present

## 2017-12-15 DIAGNOSIS — R972 Elevated prostate specific antigen [PSA]: Secondary | ICD-10-CM | POA: Diagnosis not present

## 2017-12-22 DIAGNOSIS — D6859 Other primary thrombophilia: Secondary | ICD-10-CM | POA: Diagnosis not present

## 2017-12-22 DIAGNOSIS — I63413 Cerebral infarction due to embolism of bilateral middle cerebral arteries: Secondary | ICD-10-CM | POA: Diagnosis not present

## 2017-12-22 DIAGNOSIS — D6861 Antiphospholipid syndrome: Secondary | ICD-10-CM | POA: Diagnosis not present

## 2017-12-30 DIAGNOSIS — R791 Abnormal coagulation profile: Secondary | ICD-10-CM | POA: Diagnosis not present

## 2017-12-30 DIAGNOSIS — I129 Hypertensive chronic kidney disease with stage 1 through stage 4 chronic kidney disease, or unspecified chronic kidney disease: Secondary | ICD-10-CM | POA: Diagnosis not present

## 2017-12-30 DIAGNOSIS — I359 Nonrheumatic aortic valve disorder, unspecified: Secondary | ICD-10-CM | POA: Diagnosis not present

## 2017-12-30 DIAGNOSIS — I82512 Chronic embolism and thrombosis of left femoral vein: Secondary | ICD-10-CM | POA: Diagnosis not present

## 2018-01-08 DIAGNOSIS — I63413 Cerebral infarction due to embolism of bilateral middle cerebral arteries: Secondary | ICD-10-CM | POA: Diagnosis not present

## 2018-01-08 DIAGNOSIS — D6861 Antiphospholipid syndrome: Secondary | ICD-10-CM | POA: Diagnosis not present

## 2018-02-01 DIAGNOSIS — Z0189 Encounter for other specified special examinations: Secondary | ICD-10-CM | POA: Diagnosis not present

## 2018-02-01 DIAGNOSIS — R791 Abnormal coagulation profile: Secondary | ICD-10-CM | POA: Diagnosis not present

## 2018-02-02 DIAGNOSIS — E538 Deficiency of other specified B group vitamins: Secondary | ICD-10-CM | POA: Diagnosis not present

## 2018-02-02 DIAGNOSIS — G3184 Mild cognitive impairment, so stated: Secondary | ICD-10-CM | POA: Diagnosis not present

## 2018-02-02 DIAGNOSIS — Z23 Encounter for immunization: Secondary | ICD-10-CM | POA: Diagnosis not present

## 2018-02-11 DIAGNOSIS — D6861 Antiphospholipid syndrome: Secondary | ICD-10-CM | POA: Diagnosis not present

## 2018-02-11 DIAGNOSIS — N183 Chronic kidney disease, stage 3 (moderate): Secondary | ICD-10-CM | POA: Diagnosis not present

## 2018-02-11 DIAGNOSIS — I10 Essential (primary) hypertension: Secondary | ICD-10-CM | POA: Diagnosis not present

## 2018-02-18 DIAGNOSIS — D2230 Melanocytic nevi of unspecified part of face: Secondary | ICD-10-CM | POA: Diagnosis not present

## 2018-02-18 DIAGNOSIS — L821 Other seborrheic keratosis: Secondary | ICD-10-CM | POA: Diagnosis not present

## 2018-02-18 DIAGNOSIS — L820 Inflamed seborrheic keratosis: Secondary | ICD-10-CM | POA: Diagnosis not present

## 2018-02-18 DIAGNOSIS — D224 Melanocytic nevi of scalp and neck: Secondary | ICD-10-CM | POA: Diagnosis not present

## 2018-02-28 IMAGING — CT CT CHEST W/O CM
2 of 3 series · 15 of 36 positions shown, 18 images · non-contrast
Comparison: None.

CLINICAL DATA: Abnormal chest radiograph

EXAM:
CT CHEST WITHOUT CONTRAST
TECHNIQUE: Multidetector CT imaging of the chest was performed following the
standard protocol without IV contrast.

[Series 2: thorax · axial · 0.76mm/px · z∈[-290,-12]mm · 12 of 165 slices shown, 15 images]
[im 13/165  mediastinal]
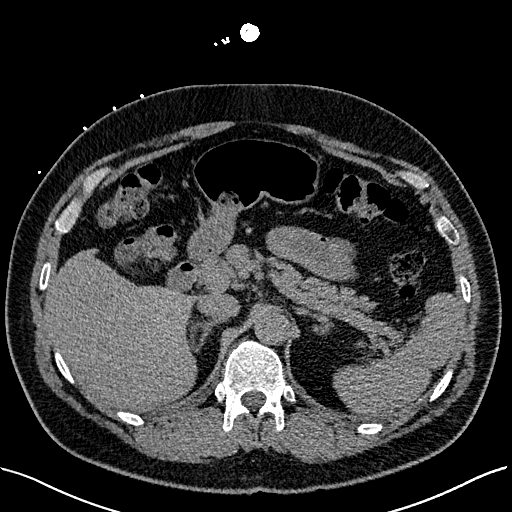
[im 13/165  lung]
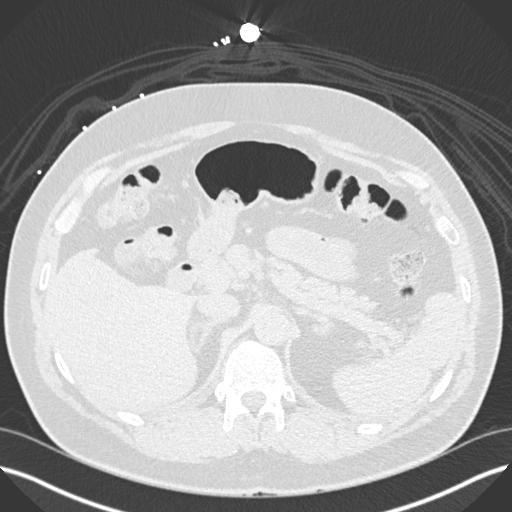
[im 25/165  lung]
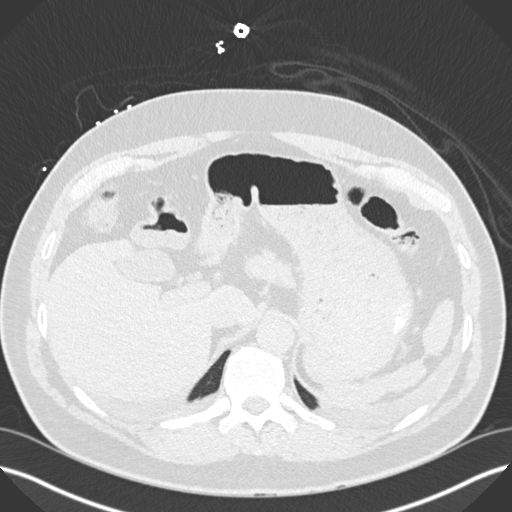
[im 37/165  lung]
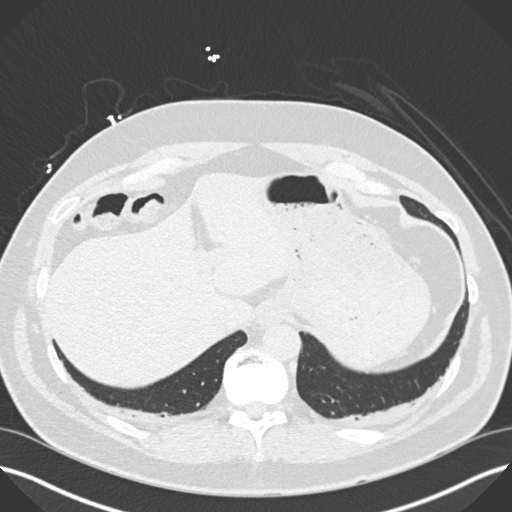
[im 49/165  lung]
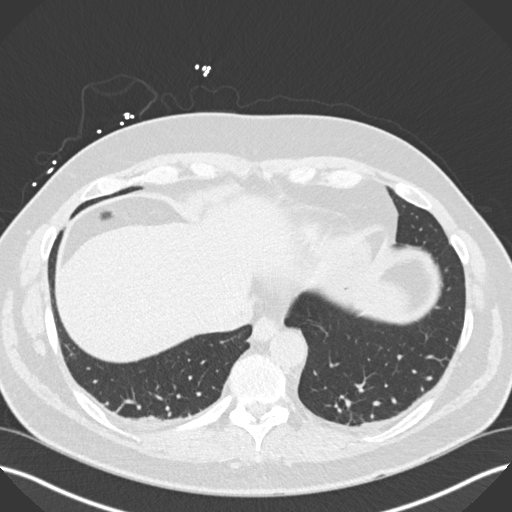
[im 61/165  mediastinal]
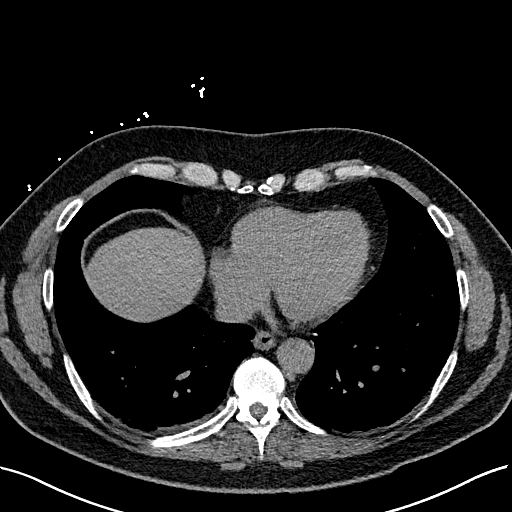
[im 61/165  lung]
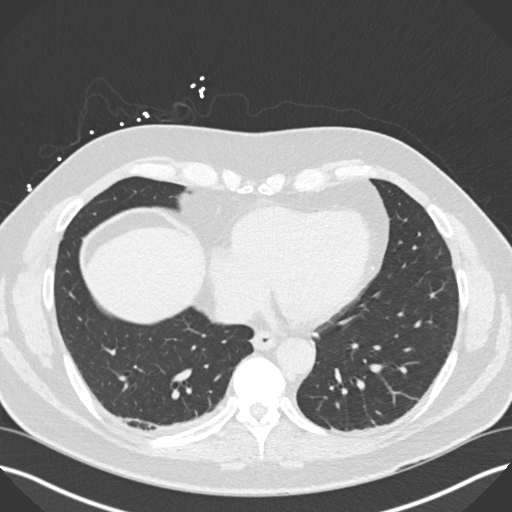
[im 73/165  lung]
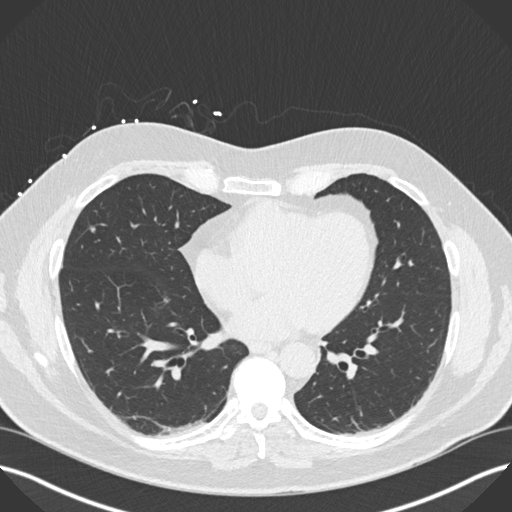
[im 92/165  lung]
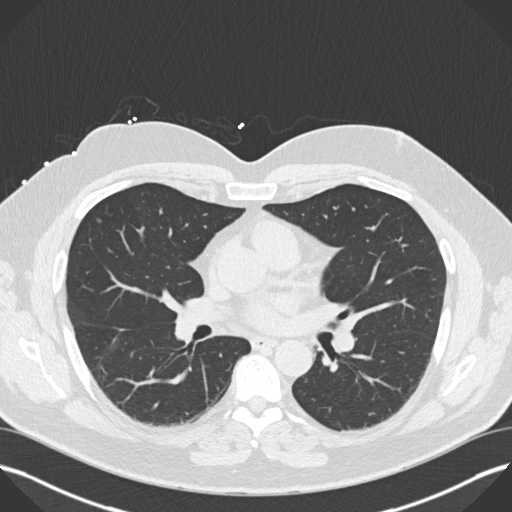
[im 104/165  lung]
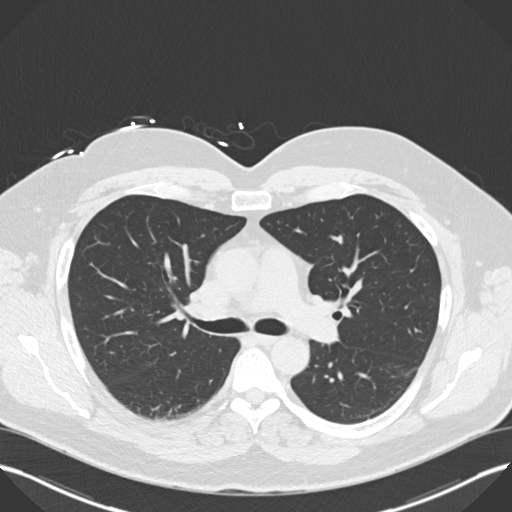
[im 116/165  mediastinal]
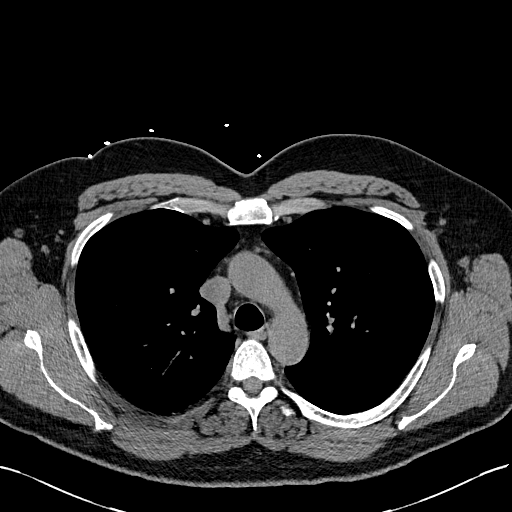
[im 116/165  lung]
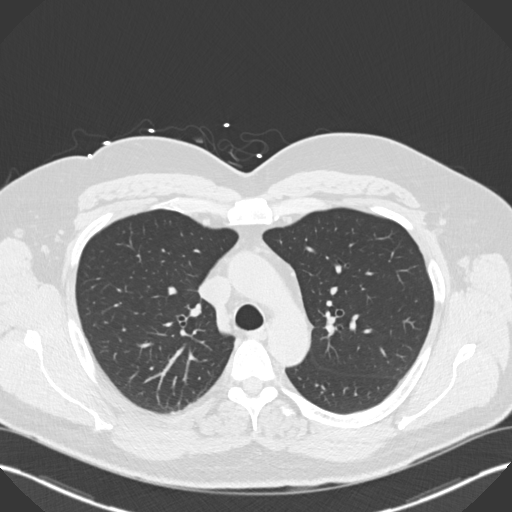
[im 128/165  lung]
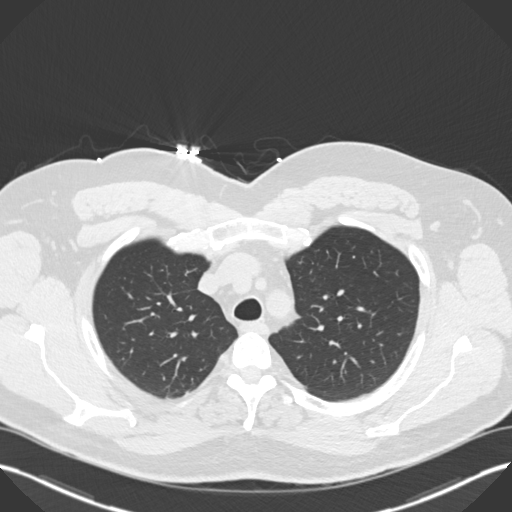
[im 140/165  lung]
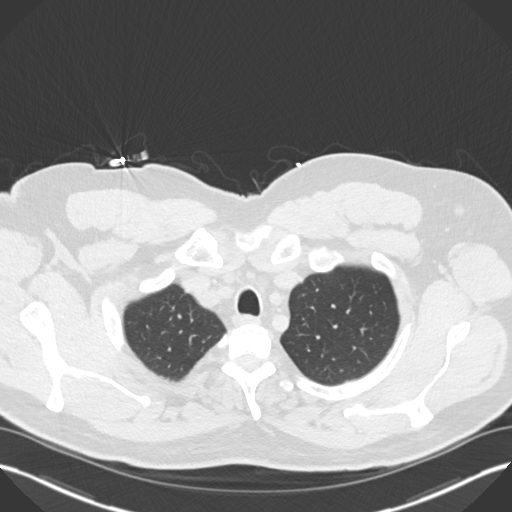
[im 152/165  lung]
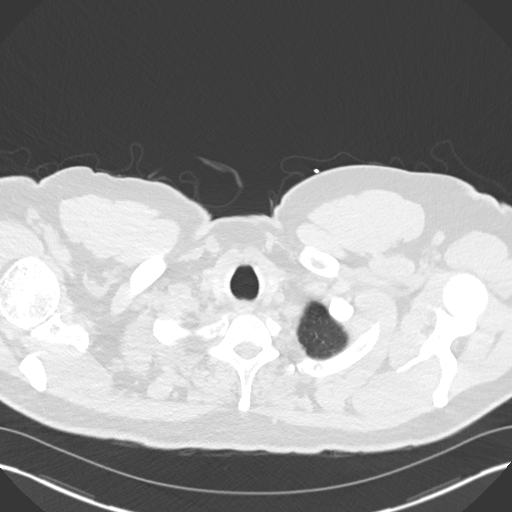

[Series 5: coronal · coronal · 0.64mm/px · 3 of 151 slices shown]
[im 31/151  lung]
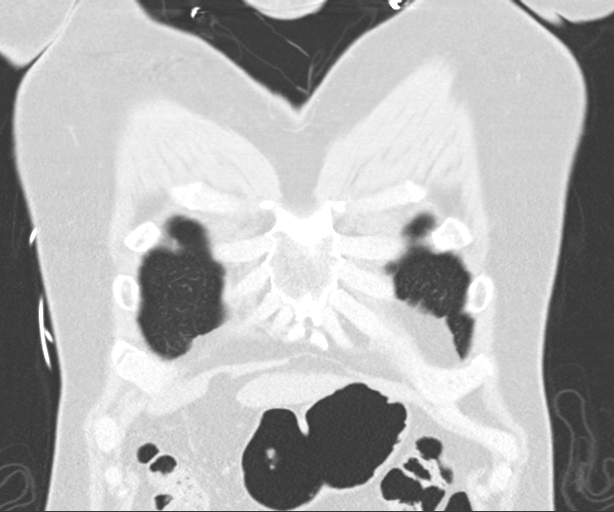
[im 61/151  lung]
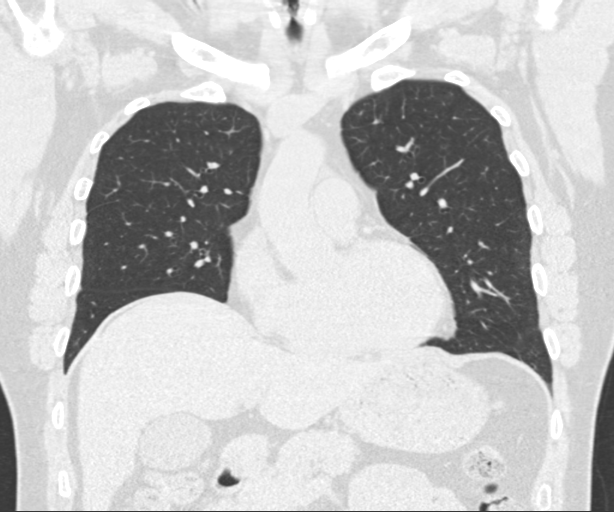
[im 91/151  lung]
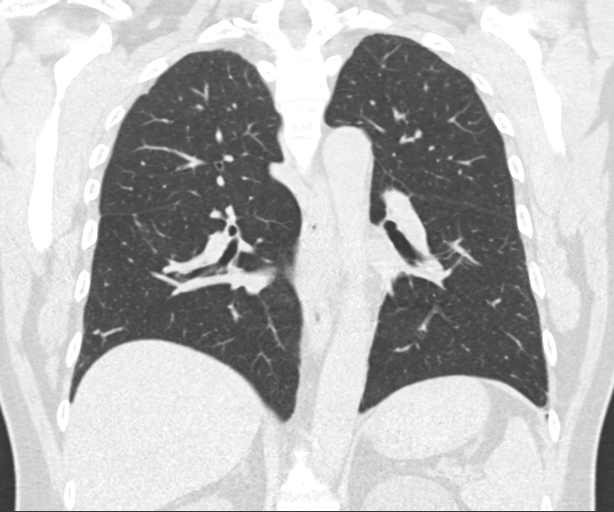

[15 of 36 positions shown; findings below may reference images not displayed]

FINDINGS: Cardiovascular: Minimal atherosclerotic calcification in the arch.
No evidence of aortic aneurysm.

Mediastinum/Nodes: No abnormal mediastinal adenopathy.

Lungs/Pleura: No pneumothorax. Tiny pleural effusions. Minimal
dependent atelectasis bilaterally. There is no evidence of a
suprahilar mass.

Upper Abdomen: Left renal cyst is partially imaged.

Musculoskeletal: No vertebral compression deformity
IMPRESSION: No acute cardiopulmonary disease. Specifically, there is no evidence
of a left suprahilar lung mass.

## 2018-02-28 IMAGING — CR DG CHEST 2V
2 series · 2 of 2 positions shown · non-contrast
Comparison: None.

CLINICAL DATA: Left-sided facial droop since last night with left
hand numbness 1 year.

EXAM:
CHEST  2 VIEW

[chest pa]
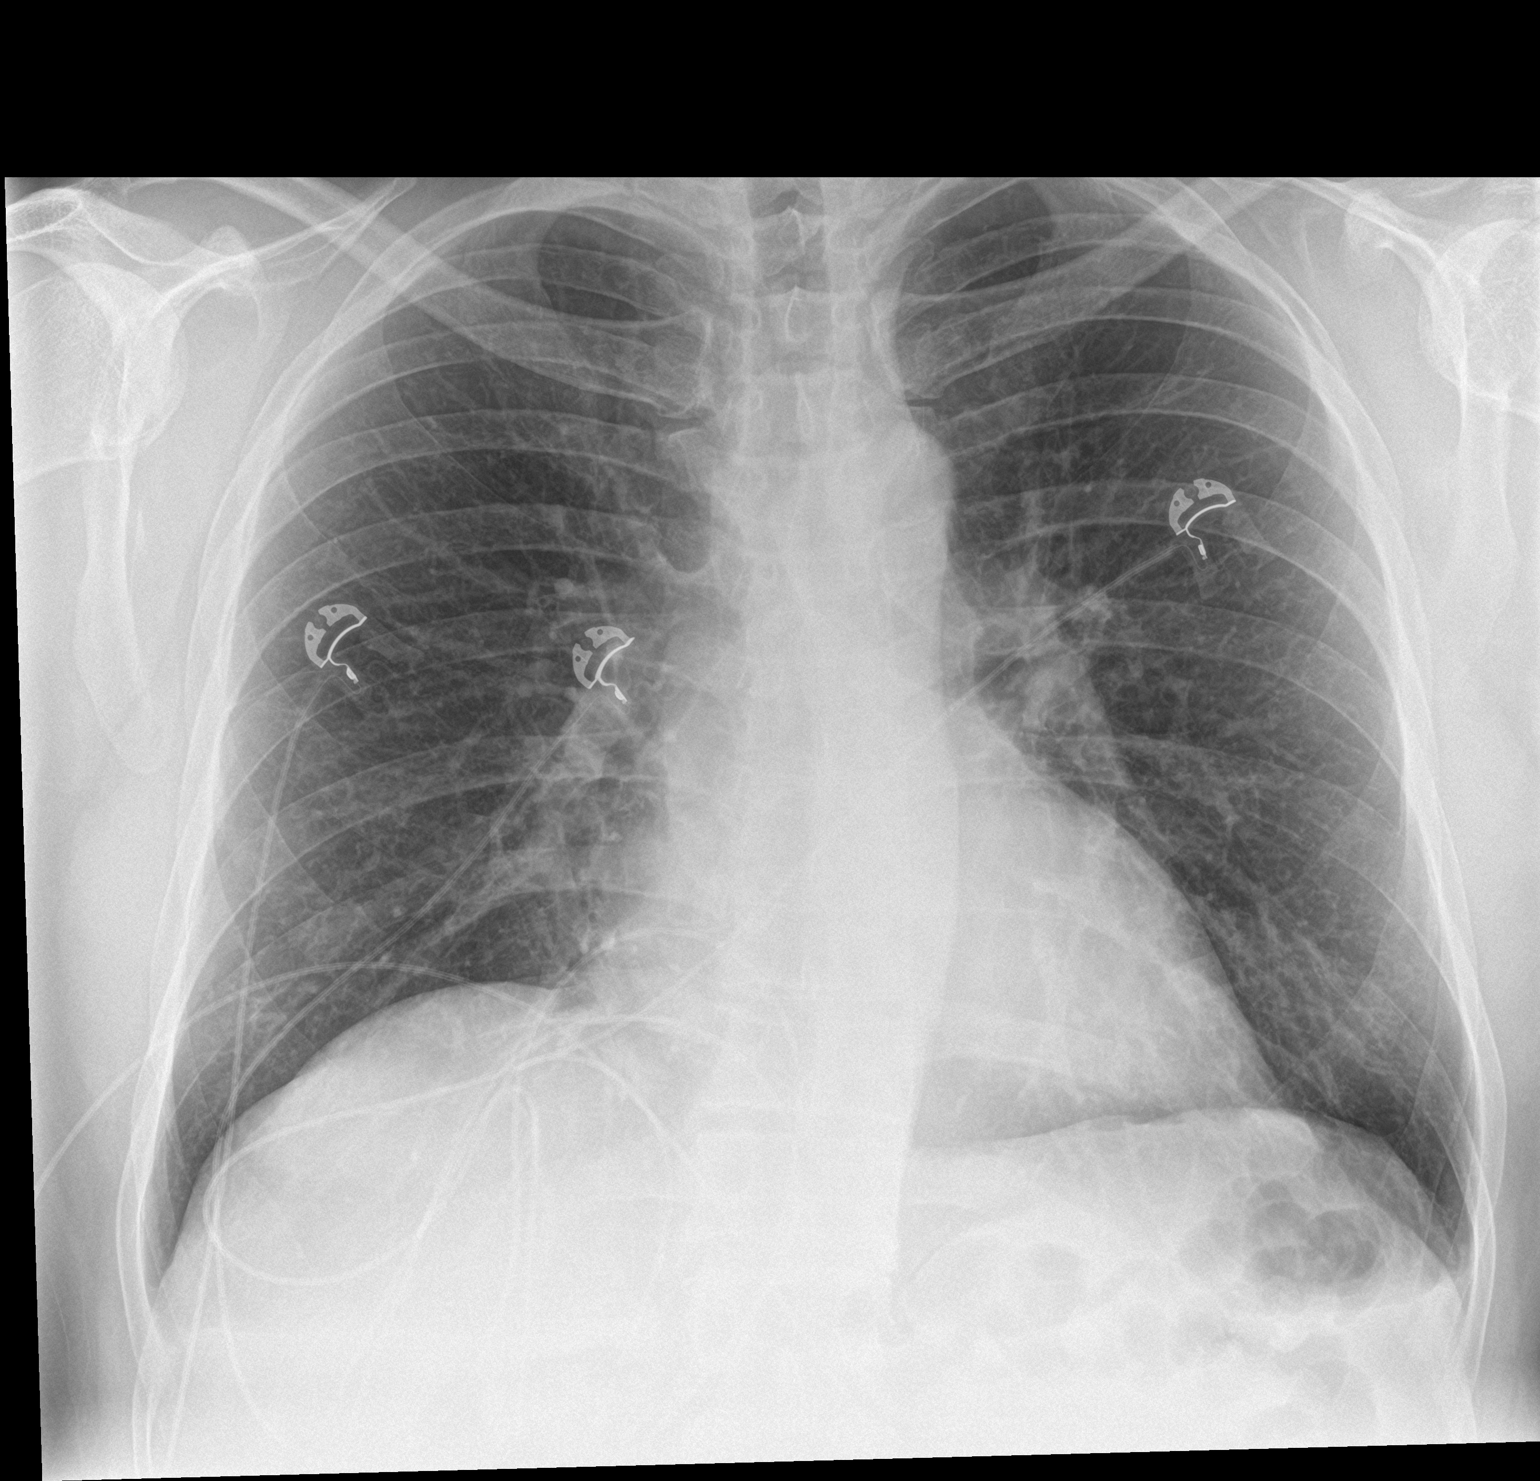

[chest lat]
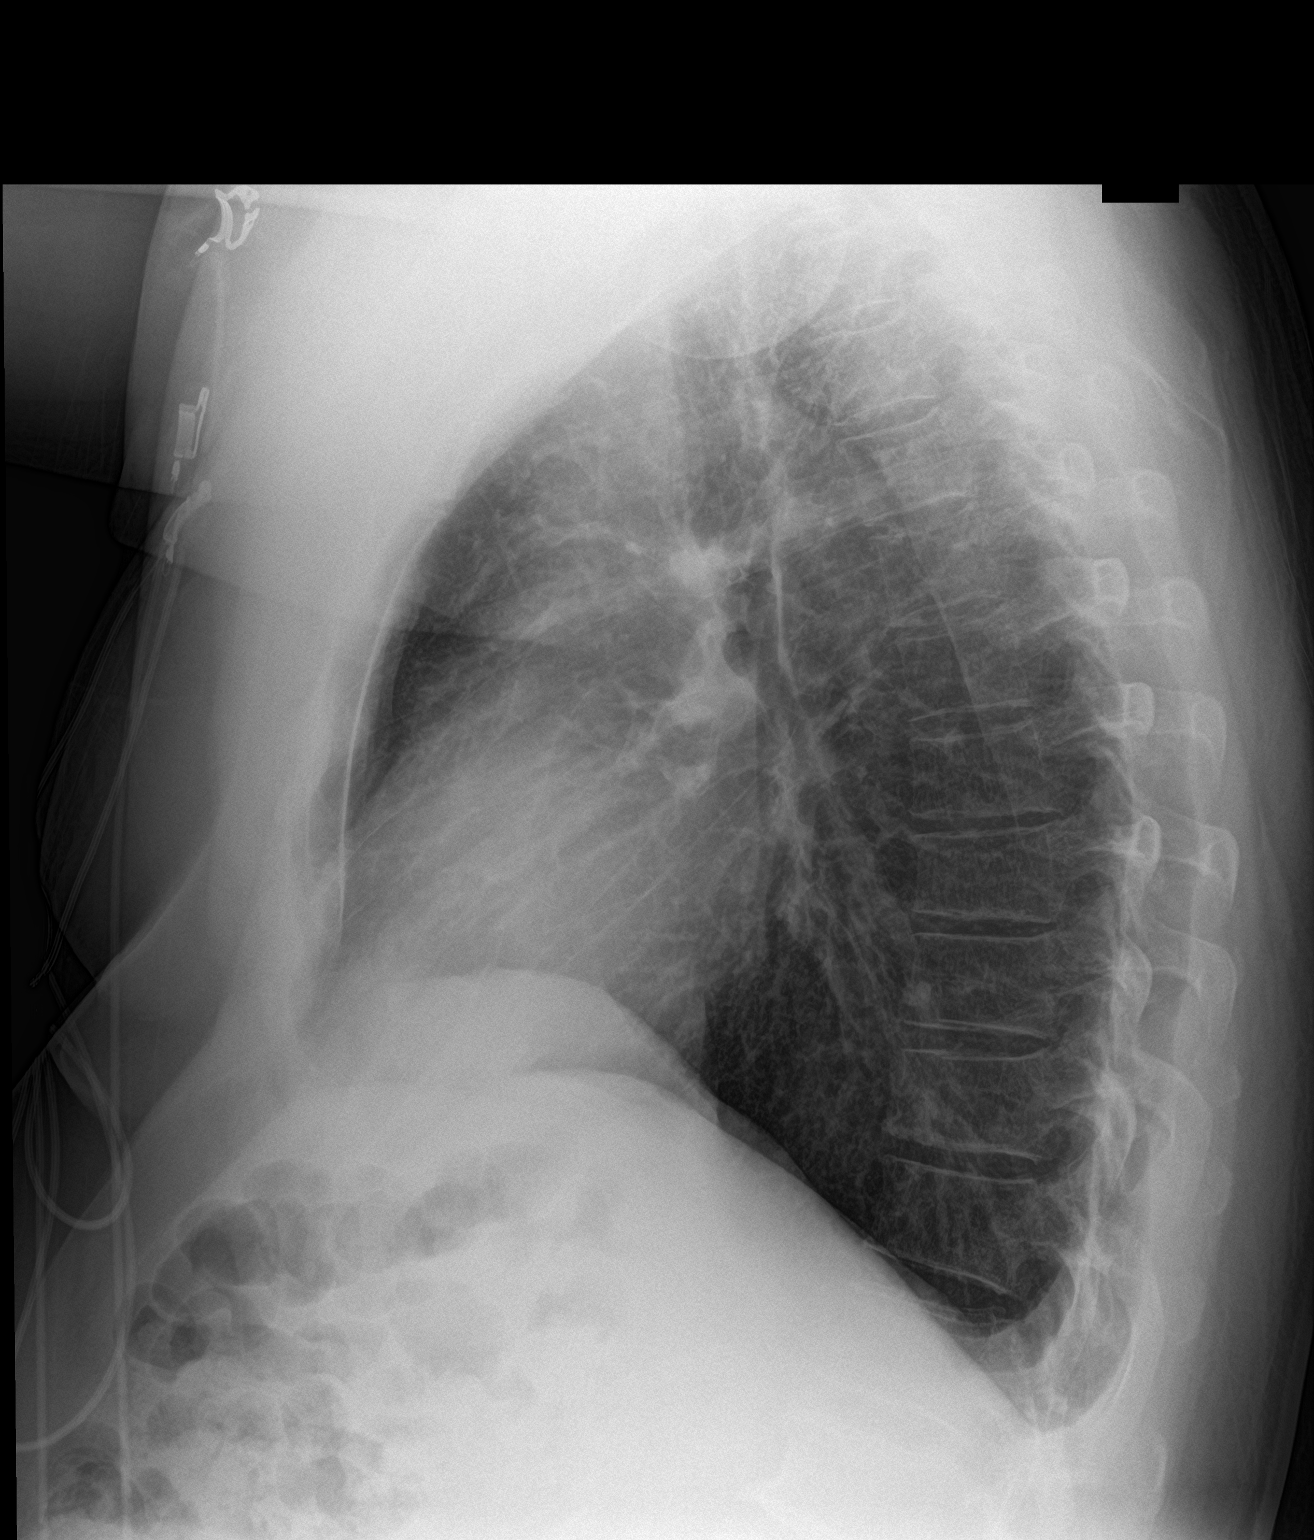

[2 of 2 positions shown; findings below may reference images not displayed]

FINDINGS: Lungs are adequately inflated without consolidation or effusion.
Increased focal density in the suprahilar region on the lateral
film. Cardiomediastinal silhouette is within normal. Remaining bones
and soft tissues are unremarkable.
IMPRESSION: No acute cardiopulmonary disease per

Increased focal density over the suprahilar region on the lateral
film as cannot exclude a central lung/hilar mass. Recommend
follow-up chest radiograph 3 months versus contrast-enhanced chest
CT on elective basis for further evaluation.

## 2018-03-04 DIAGNOSIS — Z0189 Encounter for other specified special examinations: Secondary | ICD-10-CM | POA: Diagnosis not present

## 2018-04-02 DIAGNOSIS — R791 Abnormal coagulation profile: Secondary | ICD-10-CM | POA: Diagnosis not present

## 2018-04-02 DIAGNOSIS — Z0189 Encounter for other specified special examinations: Secondary | ICD-10-CM | POA: Diagnosis not present

## 2019-07-08 ENCOUNTER — Ambulatory Visit: Payer: Managed Care, Other (non HMO) | Attending: Internal Medicine

## 2019-07-08 DIAGNOSIS — Z23 Encounter for immunization: Secondary | ICD-10-CM

## 2019-07-08 NOTE — Progress Notes (Signed)
   Covid-19 Vaccination Clinic  Name:  Tommy Daniels    MRN: [id number] DOB: March 03, 1961  07/08/2019  [name] was observed post Covid-19 immunization for 15 minutes without incident. He was provided with Vaccine Information Sheet and instruction to access the V-Safe system.   [name] was instructed to call 911 with any severe reactions post vaccine: Marland Kitchen Difficulty breathing  . Swelling of face and throat  . A fast heartbeat  . A bad rash all over body  . Dizziness and weakness   Immunizations Administered    Name Date Dose VIS Date Route   Pfizer COVID-19 Vaccine 07/08/2019  9:28 AM 0.3 mL 03/25/2019 Intramuscular   Manufacturer: [place]   Lot: [id number]   [place]: [id number]

## 2019-08-02 ENCOUNTER — Ambulatory Visit: Payer: Managed Care, Other (non HMO) | Attending: Internal Medicine

## 2019-08-02 DIAGNOSIS — Z23 Encounter for immunization: Secondary | ICD-10-CM

## 2019-08-02 NOTE — Progress Notes (Signed)
   Covid-19 Vaccination Clinic  Name:  Tommy Daniels    MRN: [id number] DOB: 12/28/1960  08/02/2019  Mr. Norlander was observed post Covid-19 immunization for 15 minutes without incident. He was provided with Vaccine Information Sheet and instruction to access the V-Safe system.   Mr. Murdough was instructed to call 911 with any severe reactions post vaccine: Marland Kitchen Difficulty breathing  . Swelling of face and throat  . A fast heartbeat  . A bad rash all over body  . Dizziness and weakness   Immunizations Administered    Name Date Dose VIS Date Route   Pfizer COVID-19 Vaccine 08/02/2019  9:24 AM 0.3 mL 06/08/2018 Intramuscular   Manufacturer: [place]   Lot: [id number]   [place]: [id number]

## 2019-10-14 ENCOUNTER — Other Ambulatory Visit: Payer: Self-pay | Admitting: Gastroenterology

## 2019-10-14 DIAGNOSIS — R1011 Right upper quadrant pain: Secondary | ICD-10-CM

## 2019-10-20 ENCOUNTER — Ambulatory Visit
Admission: RE | Admit: 2019-10-20 | Discharge: 2019-10-20 | Disposition: A | Discharge status: Home / Self Care | Payer: Managed Care, Other (non HMO) | Source: Ambulatory Visit | Attending: Gastroenterology | Admitting: Gastroenterology

## 2019-10-20 ENCOUNTER — Other Ambulatory Visit: Payer: Self-pay

## 2019-10-20 DIAGNOSIS — R1011 Right upper quadrant pain: Secondary | ICD-10-CM | POA: Insufficient documentation

## 2019-10-26 ENCOUNTER — Other Ambulatory Visit: Payer: Self-pay

## 2019-10-26 ENCOUNTER — Other Ambulatory Visit
Admission: RE | Admit: 2019-10-26 | Discharge: 2019-10-26 | Disposition: A | Discharge status: Home / Self Care | Payer: Managed Care, Other (non HMO) | Source: Ambulatory Visit | Attending: Gastroenterology | Admitting: Gastroenterology

## 2019-10-26 DIAGNOSIS — Z01812 Encounter for preprocedural laboratory examination: Secondary | ICD-10-CM | POA: Diagnosis present

## 2019-10-26 DIAGNOSIS — Z20822 Contact with and (suspected) exposure to covid-19: Secondary | ICD-10-CM | POA: Diagnosis not present

## 2019-10-26 LAB — SARS CORONAVIRUS 2 (TAT 6-24 HRS): SARS Coronavirus 2: NEGATIVE

## 2019-10-31 ENCOUNTER — Ambulatory Visit: Payer: Managed Care, Other (non HMO) | Admitting: Certified Registered"

## 2019-10-31 ENCOUNTER — Encounter
Admission: RE | Disposition: A | Discharge status: Home / Self Care | Payer: Self-pay | Source: Home / Self Care | Attending: Gastroenterology

## 2019-10-31 ENCOUNTER — Encounter: Payer: Self-pay | Admitting: *Deleted

## 2019-10-31 ENCOUNTER — Ambulatory Visit
Admission: RE | Admit: 2019-10-31 | Discharge: 2019-10-31 | Disposition: A | Discharge status: Home / Self Care | Payer: Managed Care, Other (non HMO) | Attending: Gastroenterology | Admitting: Gastroenterology

## 2019-10-31 ENCOUNTER — Other Ambulatory Visit: Payer: Self-pay

## 2019-10-31 DIAGNOSIS — G4730 Sleep apnea, unspecified: Secondary | ICD-10-CM | POA: Diagnosis not present

## 2019-10-31 DIAGNOSIS — Z1211 Encounter for screening for malignant neoplasm of colon: Secondary | ICD-10-CM | POA: Diagnosis not present

## 2019-10-31 DIAGNOSIS — Z86718 Personal history of other venous thrombosis and embolism: Secondary | ICD-10-CM | POA: Insufficient documentation

## 2019-10-31 DIAGNOSIS — Z86010 Personal history of colonic polyps: Secondary | ICD-10-CM | POA: Diagnosis present

## 2019-10-31 DIAGNOSIS — Z86018 Personal history of other benign neoplasm: Secondary | ICD-10-CM | POA: Insufficient documentation

## 2019-10-31 DIAGNOSIS — N183 Chronic kidney disease, stage 3 unspecified: Secondary | ICD-10-CM | POA: Insufficient documentation

## 2019-10-31 DIAGNOSIS — K589 Irritable bowel syndrome without diarrhea: Secondary | ICD-10-CM | POA: Insufficient documentation

## 2019-10-31 DIAGNOSIS — E039 Hypothyroidism, unspecified: Secondary | ICD-10-CM | POA: Diagnosis not present

## 2019-10-31 DIAGNOSIS — Z79899 Other long term (current) drug therapy: Secondary | ICD-10-CM | POA: Insufficient documentation

## 2019-10-31 DIAGNOSIS — Z79890 Hormone replacement therapy (postmenopausal): Secondary | ICD-10-CM | POA: Diagnosis not present

## 2019-10-31 DIAGNOSIS — F419 Anxiety disorder, unspecified: Secondary | ICD-10-CM | POA: Diagnosis not present

## 2019-10-31 DIAGNOSIS — Z7901 Long term (current) use of anticoagulants: Secondary | ICD-10-CM | POA: Diagnosis not present

## 2019-10-31 DIAGNOSIS — Z7982 Long term (current) use of aspirin: Secondary | ICD-10-CM | POA: Diagnosis not present

## 2019-10-31 DIAGNOSIS — E538 Deficiency of other specified B group vitamins: Secondary | ICD-10-CM | POA: Diagnosis not present

## 2019-10-31 DIAGNOSIS — K635 Polyp of colon: Secondary | ICD-10-CM | POA: Diagnosis not present

## 2019-10-31 DIAGNOSIS — Z8673 Personal history of transient ischemic attack (TIA), and cerebral infarction without residual deficits: Secondary | ICD-10-CM | POA: Insufficient documentation

## 2019-10-31 DIAGNOSIS — K6389 Other specified diseases of intestine: Secondary | ICD-10-CM | POA: Insufficient documentation

## 2019-10-31 DIAGNOSIS — D120 Benign neoplasm of cecum: Secondary | ICD-10-CM | POA: Diagnosis not present

## 2019-10-31 DIAGNOSIS — Z7951 Long term (current) use of inhaled steroids: Secondary | ICD-10-CM | POA: Diagnosis not present

## 2019-10-31 DIAGNOSIS — K2970 Gastritis, unspecified, without bleeding: Secondary | ICD-10-CM | POA: Diagnosis not present

## 2019-10-31 DIAGNOSIS — R1011 Right upper quadrant pain: Secondary | ICD-10-CM | POA: Diagnosis present

## 2019-10-31 HISTORY — DX: Anxiety disorder, unspecified: F41.9

## 2019-10-31 HISTORY — DX: Antiphospholipid syndrome: D68.61

## 2019-10-31 HISTORY — DX: Benign neoplasm of pituitary gland: D35.2

## 2019-10-31 HISTORY — DX: Other specified abnormal immunological findings in serum: R76.8

## 2019-10-31 HISTORY — DX: Deficiency of other specified B group vitamins: E53.8

## 2019-10-31 HISTORY — DX: Vitamin B12 deficiency anemia due to intrinsic factor deficiency: D51.0

## 2019-10-31 HISTORY — DX: Paresthesia of skin: R20.2

## 2019-10-31 HISTORY — DX: Sleep apnea, unspecified: G47.30

## 2019-10-31 HISTORY — DX: Other amnesia: R41.3

## 2019-10-31 HISTORY — DX: Hyperlipidemia, unspecified: E78.5

## 2019-10-31 HISTORY — DX: Cerebral infarction, unspecified: I63.9

## 2019-10-31 HISTORY — PX: COLONOSCOPY: SHX5424

## 2019-10-31 HISTORY — DX: Hypothyroidism, unspecified: E03.9

## 2019-10-31 HISTORY — DX: Chronic migraine without aura, not intractable, without status migrainosus: G43.709

## 2019-10-31 HISTORY — DX: Acute embolism and thrombosis of left femoral vein: I82.412

## 2019-10-31 HISTORY — DX: Chronic kidney disease, stage 3 unspecified: N18.30

## 2019-10-31 HISTORY — DX: Nonrheumatic aortic valve disorder, unspecified: I35.9

## 2019-10-31 HISTORY — PX: ESOPHAGOGASTRODUODENOSCOPY: SHX5428

## 2019-10-31 HISTORY — DX: Myalgia, unspecified site: M79.10

## 2019-10-31 SURGERY — EGD (ESOPHAGOGASTRODUODENOSCOPY)
Anesthesia: General

## 2019-10-31 MED ORDER — PROPOFOL 10 MG/ML IV BOLUS
INTRAVENOUS | Status: DC | PRN
  Administered 2019-10-31: 50 mg via INTRAVENOUS
  Administered 2019-10-31: 10 mg via INTRAVENOUS

## 2019-10-31 MED ORDER — GLYCOPYRROLATE 0.2 MG/ML IJ SOLN
INTRAMUSCULAR | Status: DC | PRN
  Administered 2019-10-31: .2 mg via INTRAVENOUS

## 2019-10-31 MED ORDER — PHENYLEPHRINE HCL (PRESSORS) 10 MG/ML IV SOLN
INTRAVENOUS | Status: DC | PRN
  Administered 2019-10-31 (×2): 100 ug via INTRAVENOUS

## 2019-10-31 MED ORDER — SODIUM CHLORIDE 0.9 % IV SOLN: INTRAVENOUS | Status: DC

## 2019-10-31 MED ORDER — EPHEDRINE 5 MG/ML INJ
INTRAVENOUS | Status: AC
  Filled 2019-10-31: qty 10

## 2019-10-31 MED ORDER — GLYCOPYRROLATE 0.2 MG/ML IJ SOLN
INTRAMUSCULAR | Status: AC
  Filled 2019-10-31: qty 3

## 2019-10-31 MED ORDER — PROPOFOL 500 MG/50ML IV EMUL
INTRAVENOUS | Status: DC | PRN
  Administered 2019-10-31: 165 ug/kg/min via INTRAVENOUS

## 2019-10-31 MED ORDER — LIDOCAINE HCL (CARDIAC) PF 100 MG/5ML IV SOSY
PREFILLED_SYRINGE | INTRAVENOUS | Status: DC | PRN
  Administered 2019-10-31: 100 mg via INTRAVENOUS

## 2019-10-31 MED ORDER — MIDAZOLAM HCL 2 MG/2ML IJ SOLN
INTRAMUSCULAR | Status: DC | PRN
  Administered 2019-10-31: 2 mg via INTRAVENOUS

## 2019-10-31 MED ORDER — MIDAZOLAM HCL 2 MG/2ML IJ SOLN
INTRAMUSCULAR | Status: AC
  Filled 2019-10-31: qty 2

## 2019-10-31 NOTE — H&P (Signed)
Outpatient short stay form Pre-procedure 10/31/2019 2:21 PM Tommy Miyamoto MD, MPH  Primary Physician: Dr. Ouida Sills   Reason for visit:  Surveillance colonoscopy. Abdominal Pain  History of present illness:  59 y/o gentleman with small adenoma in 2016 here for surveillance colonoscopy. Non-specific abdominal pain without gerd or dysphagia. Planning for EGD. Has history of anti-phospholipid syndrome so was bridged from warfarin with lovenox. Last warfarin dose was 7/9. Last lovenox was yesterday morning.   No current facility-administered medications for this encounter.  Medications Prior to Admission  Medication Sig Dispense Refill Last Dose  . aspirin 81 MG tablet Take 1 tablet (81 mg total) by mouth daily. 30 tablet 0 10/30/2019 at Unknown time  . atorvastatin (LIPITOR) 80 MG tablet Take 80 mg by mouth daily.   10/30/2019 at Unknown time  . clomiPHENE (CLOMID) 50 MG tablet Take 50 mg by mouth every other day.   10/29/2019 at Unknown time  . DULoxetine (CYMBALTA) 60 MG capsule Take 60 mg by mouth daily.   10/30/2019 at Unknown time  . enoxaparin (LOVENOX) 100 MG/ML injection Inject 100 mg into the skin. bid     . fluticasone (FLONASE) 50 MCG/ACT nasal spray Place into both nostrils daily.     Marland Kitchen levothyroxine (SYNTHROID) 125 MCG tablet Take 75 mcg by mouth daily before breakfast.    10/30/2019 at Unknown time  . losartan (COZAAR) 50 MG tablet Take 50 mg by mouth daily.     . mometasone-formoterol (DULERA) 100-5 MCG/ACT AERO Inhale 2 puffs into the lungs 2 (two) times daily.     Marland Kitchen omega-3 acid ethyl esters (LOVAZA) 1 g capsule Take by mouth 2 (two) times daily.   10/28/2019 at Unknown time  . phenylephrine (SUDAFED PE) 10 MG TABS tablet Take 10 mg by mouth every 4 (four) hours as needed.     . vitamin B-12 (CYANOCOBALAMIN) 1000 MCG tablet Take 1,000 mcg by mouth daily.   Past Week at Unknown time  . warfarin (COUMADIN) 5 MG tablet Take 5.5 mg by mouth daily. Takes one 3 mg and one 2.5 mg tablet  every day.   10/22/2019 at Unknown time  . alfuzosin (UROXATRAL) 10 MG 24 hr tablet Take 10 mg by mouth daily.     . diclofenac sodium (VOLTAREN) 1 % GEL Apply 4 g topically daily. (Patient not taking: Reported on 10/31/2019)   Not Taking at Unknown time  . ELIQUIS 5 MG TABS tablet Take 5 mg by mouth 2 (two) times daily. (Patient not taking: Reported on 10/31/2019)   Not Taking at Unknown time  . methylphenidate (RITALIN) 10 MG tablet Take 10 mg by mouth 2 (two) times daily. (Patient not taking: Reported on 10/31/2019)   Not Taking at Unknown time  . mometasone (NASONEX) 50 MCG/ACT nasal spray Place 2 sprays into the nose daily. (Patient not taking: Reported on 10/31/2019)   Not Taking  . naproxen (NAPROSYN) 500 MG tablet Take 1 tablet (500 mg total) by mouth 2 (two) times daily with a meal. (Patient not taking: Reported on 04/06/2016) 20 tablet 0   . propranolol (INDERAL) 40 MG tablet Take 40 mg by mouth 2 (two) times daily. (Patient not taking: Reported on 10/31/2019)   Not Taking at Unknown time     No Known Allergies   Past Medical History:  Diagnosis Date  . ANA positive   . Anxiety   . Aortic valve disease   . B12 deficiency   . Chronic migraine without aura   .  CKD (chronic kidney disease), stage III   . DVT femoral (deep venous thrombosis) with thrombophlebitis, left ([place])   . Hyperlipidemia   . Hypothyroidism   . IBS (irritable bowel syndrome)   . Memory loss, short term   . Myalgia   . Ocular migraine   . Paresthesia   . Pernicious anemia   . Pituitary adenoma ([place])   . Primary antiphospholipid syndrome ([place])   . Short-term memory loss   . Sleep apnea   . Stroke [facility])     Review of systems:  Otherwise negative.    Physical Exam  Gen: Alert, oriented. Appears stated age.  HEENT: PERRLA. Lungs: no respiratory distress Abd: soft, benign, no masses Ext: No edema.     Planned procedures: Proceed with EGD/colonoscopy. The patient understands the nature of the planned  procedure, indications, risks, alternatives and potential complications including but not limited to bleeding, infection, perforation, damage to internal organs and possible oversedation/side effects from anesthesia. The patient agrees and gives consent to proceed.  Please refer to procedure notes for findings, recommendations and patient disposition/instructions.     Tommy Miyamoto MD, MPH Gastroenterology 10/31/2019  2:21 PM

## 2019-10-31 NOTE — Transfer of Care (Signed)
Immediate Anesthesia Transfer of Care Note  Patient: Tommy Daniels  Procedure(s) Performed: ESOPHAGOGASTRODUODENOSCOPY (EGD) (N/A ) COLONOSCOPY (N/A )  Patient Location: Endoscopy Unit  Anesthesia Type:General  Level of Consciousness: drowsy and responds to stimulation  Airway & Oxygen Therapy: Patient Spontanous Breathing and Patient connected to face mask oxygen  Post-op Assessment: Report given to RN and Post -op Vital signs reviewed and stable  Post vital signs: Reviewed and stable  Last Vitals:  Vitals Value Taken Time  BP 98/62 10/31/19 1529  Temp    Pulse 73 10/31/19 1529  Resp 18 10/31/19 1529  SpO2 99 % 10/31/19 1529  Vitals shown include unvalidated device data.  Last Pain:  Vitals:   10/31/19 1528  TempSrc: (P) Temporal  PainSc:          Complications: No complications documented.

## 2019-10-31 NOTE — Op Note (Signed)
[facility] Gastroenterology Patient Name: Tommy Daniels Procedure Date: 10/31/2019 2:16 PM MRN: [id number] Account #: [id number] Date of Birth: 01-03-1961 Admit Type: Outpatient Age: 59 Room: [facility] ENDO ROOM 2 Gender: Male Note Status: Finalized Procedure:             Upper GI endoscopy Indications:           Abdominal pain in the right upper quadrant, Dyspepsia Providers:             Andrey Farmer MD, MD Medicines:             Monitored Anesthesia Care Complications:         No immediate complications. Estimated blood loss:                         Minimal. Procedure:             Pre-Anesthesia Assessment:                        - Prior to the procedure, a History and Physical was                         performed, and patient medications and allergies were                         reviewed. The patient is competent. The risks and                         benefits of the procedure and the sedation options and                         risks were discussed with the patient. All questions                         were answered and informed consent was obtained.                         Patient identification and proposed procedure were                         verified by the physician, the nurse, the anesthetist                         and the technician in the procedure room. Mental                         Status Examination: alert and oriented. Airway                         Examination: normal oropharyngeal airway and neck                         mobility. Respiratory Examination: clear to                         auscultation. CV Examination: normal. Prophylactic                         Antibiotics: The patient does not require prophylactic  antibiotics. Prior Anticoagulants: The patient has                         taken Lovenox (enoxaparin), last dose was 1 day prior                         to procedure. ASA Grade Assessment: III - A patient                          with severe systemic disease. After reviewing the                         risks and benefits, the patient was deemed in                         satisfactory condition to undergo the procedure. The                         anesthesia plan was to use monitored anesthesia care                         (MAC). Immediately prior to administration of                         medications, the patient was re-assessed for adequacy                         to receive sedatives. The heart rate, respiratory                         rate, oxygen saturations, blood pressure, adequacy of                         pulmonary ventilation, and response to care were                         monitored throughout the procedure. The physical                         status of the patient was re-assessed after the                         procedure.                        After obtaining informed consent, the endoscope was                         passed under direct vision. Throughout the procedure,                         the patient's blood pressure, pulse, and oxygen                         saturations were monitored continuously. The Endoscope                         was introduced through the mouth, and advanced to the  second part of duodenum. The upper GI endoscopy was                         accomplished without difficulty. The patient tolerated                         the procedure well. Findings:      The examined esophagus was normal.      Patchy mild inflammation characterized by erythema was found in the       gastric antrum. Biopsies were taken with a cold forceps for Helicobacter       pylori testing. Estimated blood loss was minimal.      The examined duodenum was normal. Impression:            - Normal esophagus.                        - Gastritis. Biopsied.                        - Normal examined duodenum. Recommendation:        - NPO.                         - Continue present medications.                        - Await pathology results.                        - Perform a colonoscopy as previously scheduled. Procedure Code(s):     --- Professional ---                        [phone], Esophagogastroduodenoscopy, flexible,                         transoral; with biopsy, single or multiple Diagnosis Code(s):     --- Professional ---                        K29.70, Gastritis, unspecified, without bleeding                        R10.11, Right upper quadrant pain                        R10.13, Epigastric pain CPT copyright 2019 American [employer] Association. All rights reserved. The codes documented in this report are preliminary and upon coder review may  be revised to meet current compliance requirements. Andrey Farmer, MD Andrey Farmer MD, MD 10/31/2019 3:30:07 PM Number of Addenda: 0 Note Initiated On: 10/31/2019 2:16 PM Estimated Blood Loss:  Estimated blood loss was minimal.      [facility]

## 2019-10-31 NOTE — Anesthesia Procedure Notes (Signed)
Procedure Name: General with mask airway °Performed by: Fletcher-Harrison, Bryah [place], CRNA °Pre-anesthesia Checklist: Patient identified, Emergency Drugs available, Suction available and Patient being monitored °Oxygen Delivery Method: Simple face mask °Induction Type: IV induction °Placement Confirmation: positive ETCO2 and CO2 detector °Dental Injury: Teeth and Oropharynx as per pre-operative assessment  ° ° ° ° ° ° °

## 2019-10-31 NOTE — Anesthesia Postprocedure Evaluation (Signed)
Anesthesia Post Note  Patient: Tommy Daniels  Procedure(s) Performed: ESOPHAGOGASTRODUODENOSCOPY (EGD) (N/A ) COLONOSCOPY (N/A )  Patient location during evaluation: Endoscopy Anesthesia Type: General Level of consciousness: awake and alert and oriented Pain management: pain level controlled Vital Signs Assessment: post-procedure vital signs reviewed and stable Respiratory status: spontaneous breathing, nonlabored ventilation and respiratory function stable Cardiovascular status: blood pressure returned to baseline and stable Postop Assessment: no signs of nausea or vomiting Anesthetic complications: no   No complications documented.   Last Vitals:  Vitals:   10/31/19 1548 10/31/19 1558  BP: 117/78 119/83  Pulse: 87 75  Resp: (!) 26 (!) 21  Temp:    SpO2: 97% 98%    Last Pain:  Vitals:   10/31/19 1558  TempSrc:   PainSc: 0-No pain                 Elma Limas

## 2019-10-31 NOTE — Anesthesia Preprocedure Evaluation (Addendum)
Anesthesia Evaluation  Patient identified by MRN, date of birth, ID band Patient awake    Reviewed: Allergy & Precautions, H&P , NPO status , reviewed documented beta blocker date and time   Airway Mallampati: III  TM Distance: >3 FB Neck ROM: full    Dental  (+) Teeth Intact   Pulmonary sleep apnea and Continuous Positive Airway Pressure Ventilation ,    Pulmonary exam normal        Cardiovascular Normal cardiovascular exam  2017 ECHO Study Conclusions   - Left ventricle: The cavity size was normal. Wall thickness was  normal. Systolic function was normal. The estimated ejection  fraction was in the range of 55% to 60%.  - Aortic valve: Thickening. There was mild regurgitation.  - Mitral valve: No evidence of vegetation.  - Right atrium: No evidence of thrombus in the atrial cavity or  appendage.  - Atrial septum: No defect or patent foramen ovale was identified.  - Tricuspid valve: No evidence of vegetation.  - Pulmonic valve: No evidence of vegetation.  - aortic left cornonary cusp with significant thickening without  overt vegetaion    Neuro/Psych  Headaches, PSYCHIATRIC DISORDERS Anxiety CVA, No Residual Symptoms    GI/Hepatic neg GERD  ,  Endo/Other  Hypothyroidism   Renal/GU Renal disease     Musculoskeletal   Abdominal   Peds  Hematology  (+) Blood dyscrasia, anemia ,   Anesthesia Other Findings Past Medical History: No date: ANA positive No date: Anxiety No date: Aortic valve disease No date: B12 deficiency No date: Chronic migraine without aura No date: CKD (chronic kidney disease), stage III No date: DVT femoral (deep venous thrombosis) with thrombophlebitis,  left (HCC) No date: Hyperlipidemia No date: Hypothyroidism No date: IBS (irritable bowel syndrome) No date: Memory loss, short term No date: Myalgia No date: Ocular migraine No date: Paresthesia No date: Pernicious anemia No  date: Pituitary adenoma ([place]) No date: Primary antiphospholipid syndrome (HCC) No date: Short-term memory loss No date: Sleep apnea No date: Stroke [facility])  Past Surgical History: No date: COLONOSCOPY No date: ESOPHAGOGASTRODUODENOSCOPY No date: none 04/08/2016: TEE WITHOUT CARDIOVERSION; N/A     Comment:  Procedure: TRANSESOPHAGEAL ECHOCARDIOGRAM (TEE);                Surgeon: Corey Skains, MD;  Location: [facility] ORS;                Service: Cardiovascular;  Laterality: N/A;     Reproductive/Obstetrics                            Anesthesia Physical Anesthesia Plan  ASA: III  Anesthesia Plan: General   Post-op Pain Management:    Induction: Intravenous  PONV Risk Score and Plan: Treatment may vary due to age or medical condition and TIVA  Airway Management Planned: Nasal Cannula and Natural Airway  Additional Equipment:   Intra-op Plan:   Post-operative Plan:   Informed Consent: I have reviewed the patients History and Physical, chart, labs and discussed the procedure including the risks, benefits and alternatives for the proposed anesthesia with the patient or authorized representative who has indicated his/her understanding and acceptance.     Dental Advisory Given  Plan Discussed with: CRNA  Anesthesia Plan Comments:        Anesthesia Quick Evaluation

## 2019-10-31 NOTE — Op Note (Signed)
[facility] Gastroenterology Patient Name: Tommy Daniels Procedure Date: 10/31/2019 2:15 PM MRN: [id number] Account #: [id number] Date of Birth: [birth year] Admit Type: Outpatient Age: 59 Room: [facility] ENDO ROOM 2 Gender: Male Note Status: Finalized Procedure:             Colonoscopy Indications:           High risk colon cancer surveillance: Personal history                         of adenoma less than 10 mm in size Providers:             Andrey Farmer MD, MD Medicines:             Monitored Anesthesia Care Complications:         No immediate complications. Estimated blood loss:                         Minimal. Procedure:             Pre-Anesthesia Assessment:                        - Prior to the procedure, a History and Physical was                         performed, and patient medications and allergies were                         reviewed. The patient is competent. The risks and                         benefits of the procedure and the sedation options and                         risks were discussed with the patient. All questions                         were answered and informed consent was obtained.                         Patient identification and proposed procedure were                         verified by the physician, the nurse, the anesthetist                         and the technician in the endoscopy suite. Mental                         Status Examination: alert and oriented. Airway                         Examination: normal oropharyngeal airway and neck                         mobility. Respiratory Examination: clear to                         auscultation. CV Examination: normal. Prophylactic  Antibiotics: The patient does not require prophylactic                         antibiotics. Prior Anticoagulants: The patient has                         taken Lovenox (enoxaparin), last dose was 1 day prior                          to procedure. ASA Grade Assessment: III - A patient                         with severe systemic disease. After reviewing the                         risks and benefits, the patient was deemed in                         satisfactory condition to undergo the procedure. The                         anesthesia plan was to use monitored anesthesia care                         (MAC). Immediately prior to administration of                         medications, the patient was re-assessed for adequacy                         to receive sedatives. The heart rate, respiratory                         rate, oxygen saturations, blood pressure, adequacy of                         pulmonary ventilation, and response to care were                         monitored throughout the procedure. The physical                         status of the patient was re-assessed after the                         procedure.                        After obtaining informed consent, the colonoscope was                         passed under direct vision. Throughout the procedure,                         the patient's blood pressure, pulse, and oxygen                         saturations were monitored continuously. The  Colonoscope was introduced through the anus and                         advanced to the the cecum, identified by appendiceal                         orifice and ileocecal valve. The colonoscopy was                         technically difficult and complex due to significant                         looping. Successful completion of the procedure was                         aided by applying abdominal pressure. The patient                         tolerated the procedure well. The quality of the bowel                         preparation was adequate to identify polyps. Findings:      The perianal and digital rectal examinations were normal.      A 1 mm polyp was found in the cecum. The polyp  was sessile. The polyp       was removed with a jumbo cold forceps. Resection and retrieval were       complete. Estimated blood loss was minimal.      A 2 mm polyp was found in the ileocecal valve. The polyp was sessile.       The polyp was removed with a jumbo cold forceps. Resection and retrieval       were complete. Estimated blood loss was minimal.      A 3 mm polyp was found in the transverse colon. The polyp was sessile.       The polyp was removed with a jumbo cold forceps. Resection and retrieval       were complete. Estimated blood loss was minimal.      The exam was otherwise without abnormality on direct and retroflexion       views. Impression:            - One 1 mm polyp in the cecum, removed with a jumbo                         cold forceps. Resected and retrieved.                        - One 2 mm polyp at the ileocecal valve, removed with                         a jumbo cold forceps. Resected and retrieved.                        - One 3 mm polyp in the transverse colon, removed with                         a jumbo cold forceps. Resected and retrieved.                        -  The examination was otherwise normal on direct and                         retroflexion views. Recommendation:        - Discharge patient to home.                        - Resume previous diet.                        - Resume Coumadin (warfarin) at prior dose today.                         Refer to managing physician for further adjustment of                         therapy.                        - Await pathology results.                        - Repeat colonoscopy in 3 - 5 years for surveillance                         based on pathology results.                        - Return to referring physician as previously                         scheduled. Procedure Code(s):     --- Professional ---                        [phone], Colonoscopy, flexible; with biopsy, single or                          multiple Diagnosis Code(s):     --- Professional ---                        Z86.010, Personal history of colonic polyps                        K63.5, Polyp of colon CPT copyright 2019 American Medical Association. All rights reserved. The codes documented in this report are preliminary and upon coder review may  be revised to meet current compliance requirements. Andrey Farmer, MD Andrey Farmer MD, MD 10/31/2019 3:34:56 PM Number of Addenda: 0 Note Initiated On: 10/31/2019 2:15 PM Scope Withdrawal Time: 0 hours 17 minutes 5 seconds  Total Procedure Duration: 0 hours 27 minutes 4 seconds  Estimated Blood Loss:  Estimated blood loss was minimal.      [facility]

## 2019-11-01 ENCOUNTER — Encounter: Payer: Self-pay | Admitting: Gastroenterology

## 2019-11-03 LAB — SURGICAL PATHOLOGY

## 2019-12-16 IMAGING — US US RENAL
1 series · 14 of 25 positions shown · non-contrast
Comparison: CT abdomen and pelvis 11/04/2006

CLINICAL DATA: Chronic kidney disease, stage III.

EXAM:
RENAL / URINARY TRACT ULTRASOUND COMPLETE

[Series 1: us renal · 0.28mm/px · 14 of 42 slices shown]
[im 1/42]
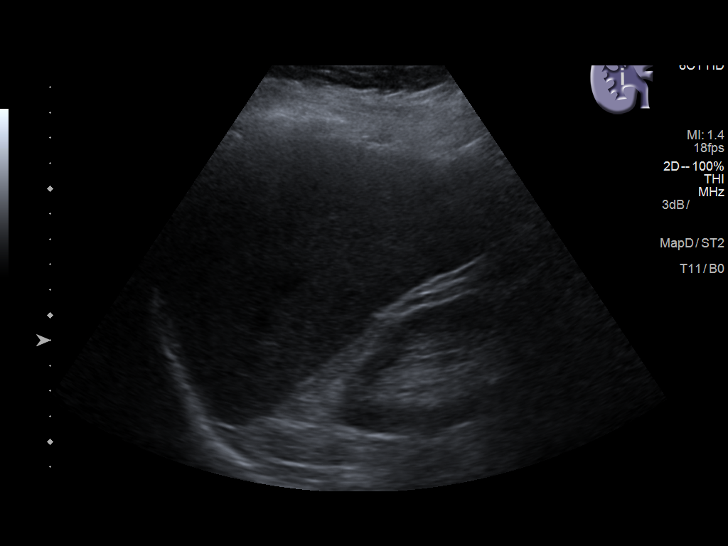
[im 4/42]
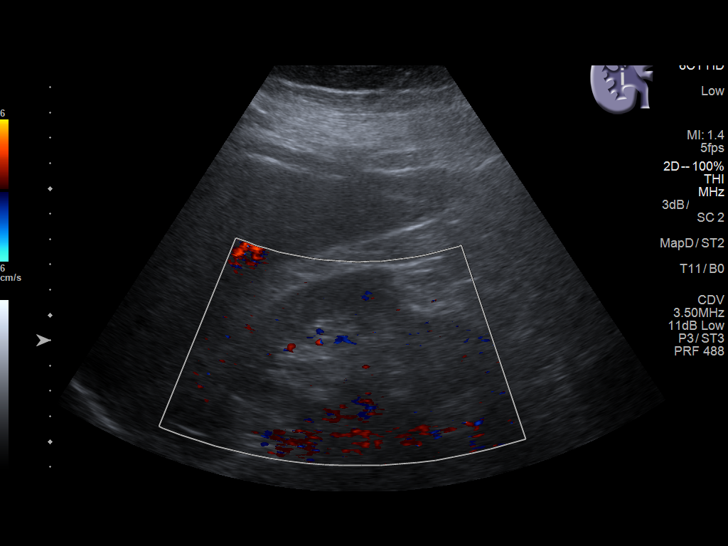
[im 7/42]
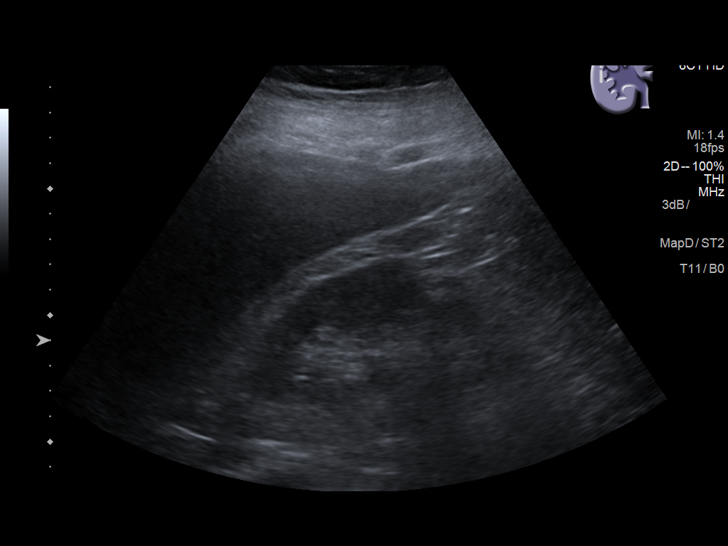
[im 11/42]
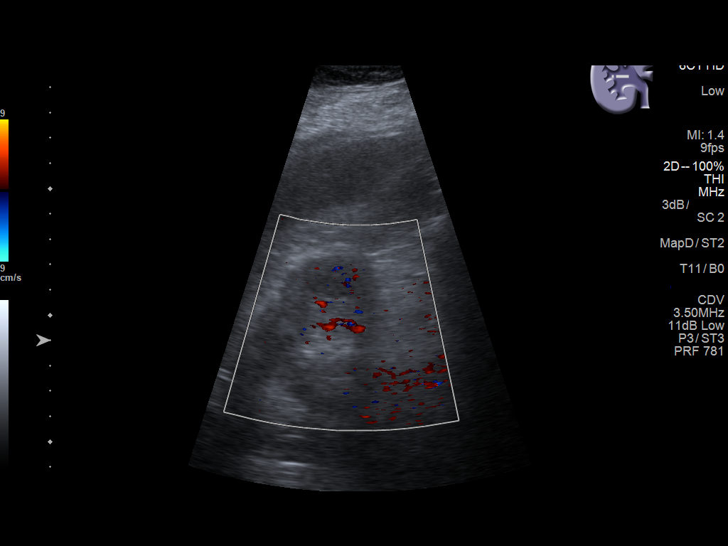
[im 14/42]
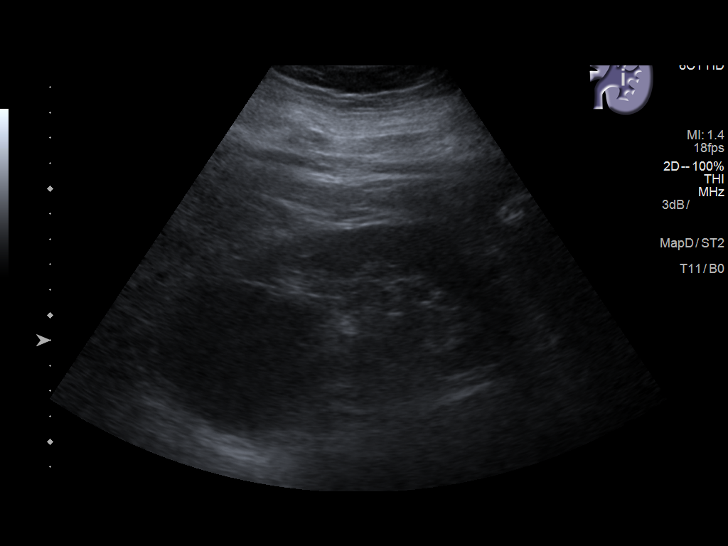
[im 16/42]
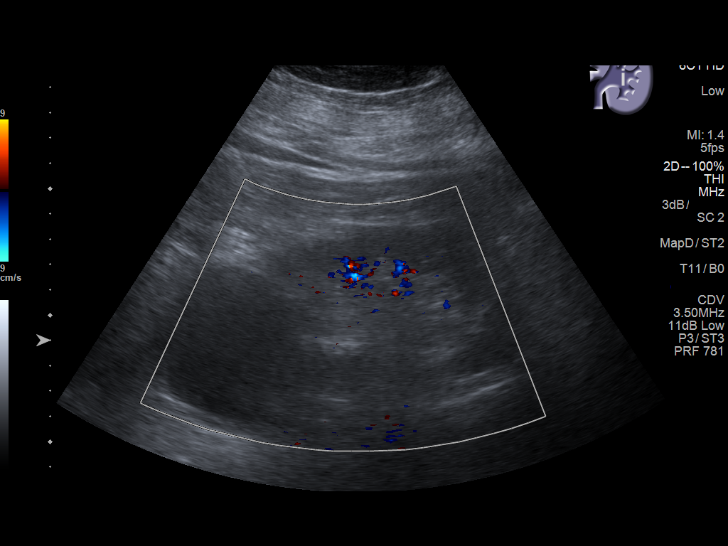
[im 19/42]
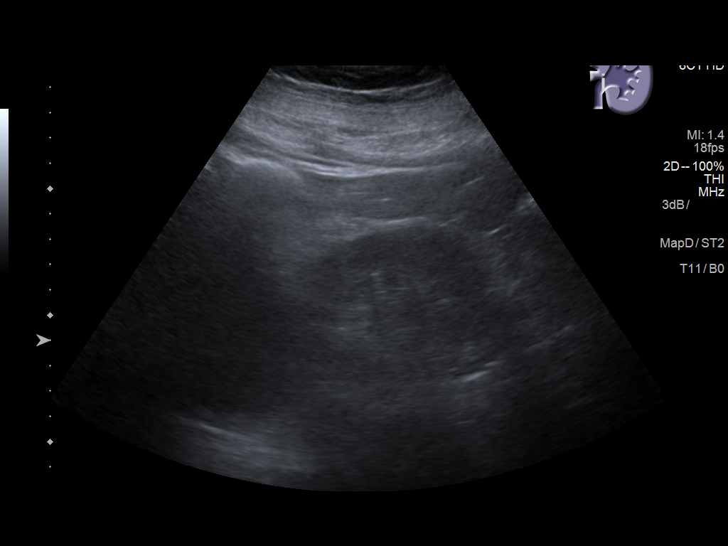
[im 23/42]
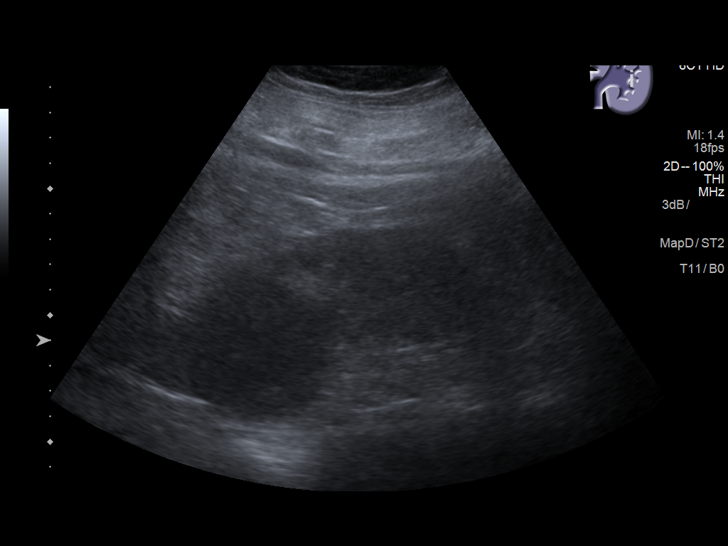
[im 26/42]
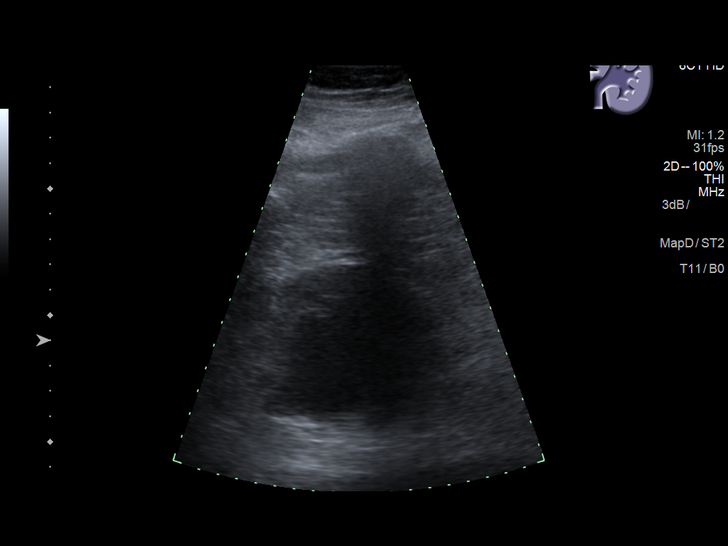
[im 28/42]
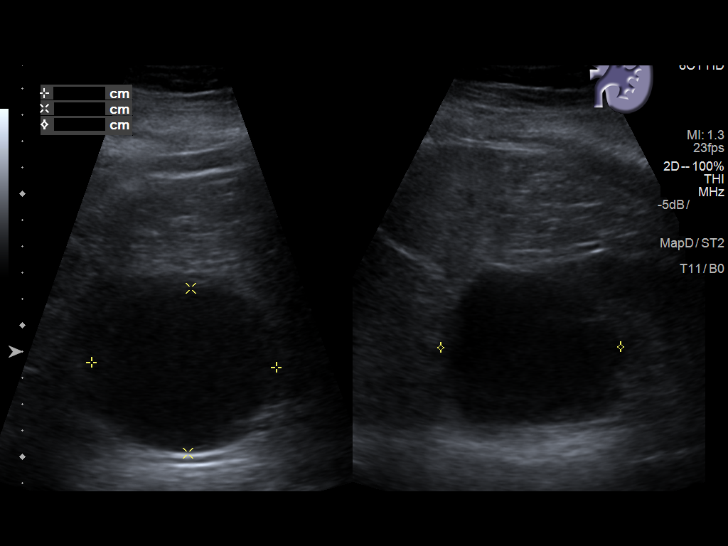
[im 31/42]
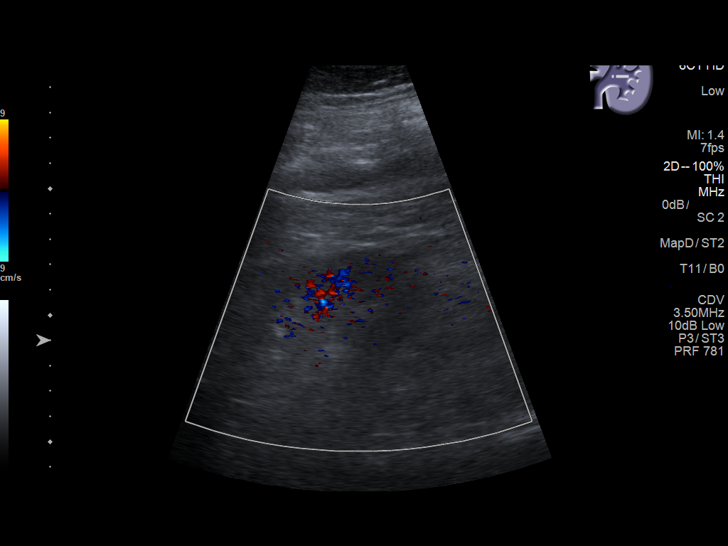
[im 35/42]
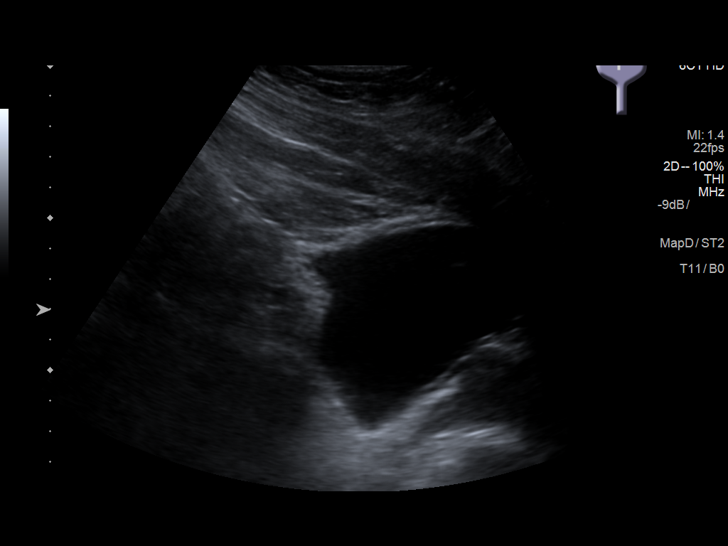
[im 38/42]
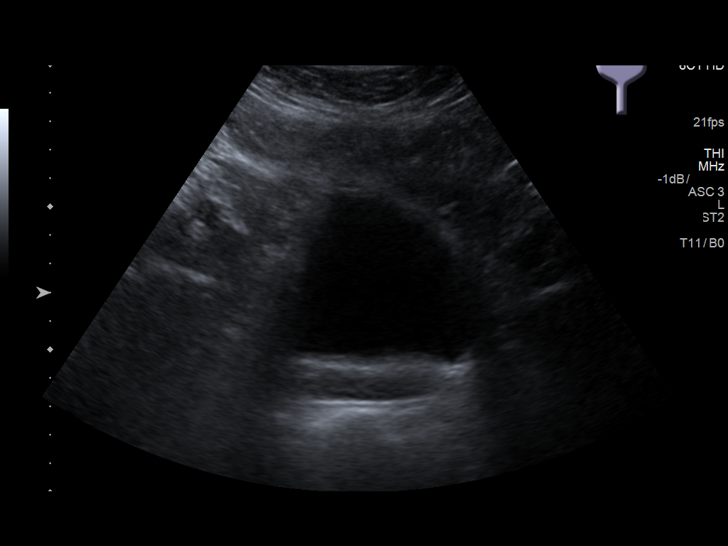
[im 42/42]
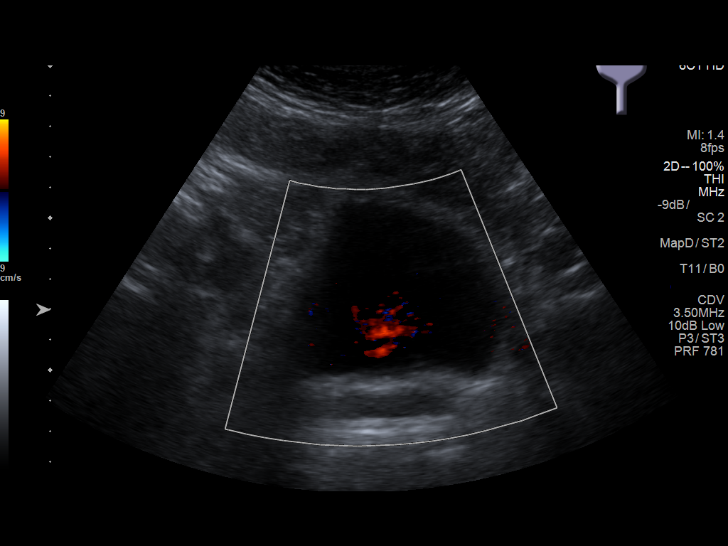

[14 of 25 positions shown; findings below may reference images not displayed]

FINDINGS: Right Kidney:

Length: 11.2 cm. Echogenicity within normal limits. No mass or
hydronephrosis visualized.

Left Kidney:

Length: 12.0 cm. Echogenicity within normal limits. No
hydronephrosis. 6.8 x 6.3 x 7.0 cm upper pole cyst (3.5 cm on the
prior CT)..

Bladder:

Appears normal for degree of bladder distention.
IMPRESSION: 1. No hydronephrosis.
2. 7 cm left renal cyst, enlarged from 5222.

## 2020-11-05 ENCOUNTER — Other Ambulatory Visit (HOSPITAL_COMMUNITY): Payer: Self-pay | Admitting: Neurology

## 2020-11-05 ENCOUNTER — Other Ambulatory Visit: Payer: Self-pay | Admitting: Neurology

## 2020-11-05 DIAGNOSIS — R292 Abnormal reflex: Secondary | ICD-10-CM

## 2020-11-14 ENCOUNTER — Ambulatory Visit
Admission: RE | Admit: 2020-11-14 | Discharge: 2020-11-14 | Disposition: A | Discharge status: Home / Self Care | Payer: Managed Care, Other (non HMO) | Source: Ambulatory Visit | Attending: Neurology | Admitting: Neurology

## 2020-11-14 ENCOUNTER — Other Ambulatory Visit: Payer: Self-pay

## 2020-11-14 DIAGNOSIS — R292 Abnormal reflex: Secondary | ICD-10-CM | POA: Diagnosis present

## 2023-04-17 ENCOUNTER — Other Ambulatory Visit
Admission: RE | Admit: 2023-04-17 | Discharge: 2023-04-17 | Disposition: A | Discharge status: Home / Self Care | Payer: Managed Care, Other (non HMO) | Source: Ambulatory Visit | Attending: Internal Medicine | Admitting: Internal Medicine

## 2023-04-17 DIAGNOSIS — I129 Hypertensive chronic kidney disease with stage 1 through stage 4 chronic kidney disease, or unspecified chronic kidney disease: Secondary | ICD-10-CM | POA: Insufficient documentation

## 2023-04-17 DIAGNOSIS — N183 Chronic kidney disease, stage 3 unspecified: Secondary | ICD-10-CM | POA: Diagnosis present

## 2023-04-17 LAB — PROTIME-INR
INR: 1.6 — ABNORMAL HIGH (ref 0.8–1.2)
Prothrombin Time: 18.9 s — ABNORMAL HIGH (ref 11.4–15.2)
